# Patient Record
Sex: Male | Born: 1975 | Race: White | Hispanic: No | Marital: Married | Smoking: Former smoker
Health system: Southern US, Community
[De-identification: ages and names within clinical notes are randomized; demographics above are authoritative.]

## PROBLEM LIST (undated history)

## (undated) DIAGNOSIS — R519 Headache, unspecified: Secondary | ICD-10-CM

## (undated) DIAGNOSIS — K5790 Diverticulosis of intestine, part unspecified, without perforation or abscess without bleeding: Secondary | ICD-10-CM

## (undated) DIAGNOSIS — K648 Other hemorrhoids: Secondary | ICD-10-CM

## (undated) DIAGNOSIS — K219 Gastro-esophageal reflux disease without esophagitis: Secondary | ICD-10-CM

## (undated) DIAGNOSIS — K2270 Barrett's esophagus without dysplasia: Secondary | ICD-10-CM

## (undated) HISTORY — DX: Other hemorrhoids: K64.8

## (undated) HISTORY — DX: Barrett's esophagus without dysplasia: K22.70

## (undated) HISTORY — DX: Diverticulosis of intestine, part unspecified, without perforation or abscess without bleeding: K57.90

## (undated) HISTORY — DX: Headache, unspecified: R51.9

## (undated) HISTORY — PX: UPPER GASTROINTESTINAL ENDOSCOPY: SHX188

## (undated) HISTORY — DX: Gastro-esophageal reflux disease without esophagitis: K21.9

---
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
## 1996-03-28 HISTORY — PX: WISDOM TOOTH EXTRACTION: SHX21

## 1997-03-28 HISTORY — PX: OTHER SURGICAL HISTORY: SHX169

## 2004-12-27 ENCOUNTER — Emergency Department (HOSPITAL_COMMUNITY): Admission: EM | Admit: 2004-12-27 | Discharge: 2004-12-27 | Payer: Self-pay | Admitting: Emergency Medicine

## 2005-01-23 ENCOUNTER — Emergency Department (HOSPITAL_COMMUNITY): Admission: EM | Admit: 2005-01-23 | Discharge: 2005-01-23 | Payer: Self-pay | Admitting: Emergency Medicine

## 2005-03-05 ENCOUNTER — Emergency Department (HOSPITAL_COMMUNITY): Admission: AD | Admit: 2005-03-05 | Discharge: 2005-03-05 | Payer: Self-pay | Admitting: Family Medicine

## 2005-08-17 ENCOUNTER — Emergency Department (HOSPITAL_COMMUNITY): Admission: EM | Admit: 2005-08-17 | Discharge: 2005-08-17 | Payer: Self-pay | Admitting: Family Medicine

## 2005-10-13 ENCOUNTER — Emergency Department (HOSPITAL_COMMUNITY): Admission: EM | Admit: 2005-10-13 | Discharge: 2005-10-13 | Payer: Self-pay | Admitting: Family Medicine

## 2008-07-29 ENCOUNTER — Emergency Department (HOSPITAL_COMMUNITY): Admission: EM | Admit: 2008-07-29 | Discharge: 2008-07-29 | Payer: Self-pay | Admitting: Emergency Medicine

## 2008-09-07 ENCOUNTER — Emergency Department (HOSPITAL_COMMUNITY): Admission: EM | Admit: 2008-09-07 | Discharge: 2008-09-07 | Payer: Self-pay | Admitting: Emergency Medicine

## 2009-04-11 ENCOUNTER — Emergency Department (HOSPITAL_COMMUNITY): Admission: EM | Admit: 2009-04-11 | Discharge: 2009-04-11 | Payer: Self-pay | Admitting: Family Medicine

## 2009-06-15 ENCOUNTER — Ambulatory Visit: Payer: Self-pay | Admitting: Family Medicine

## 2009-06-17 ENCOUNTER — Telehealth: Payer: Self-pay | Admitting: Family Medicine

## 2009-06-17 LAB — CONVERTED CEMR LAB
Albumin: 4.3 g/dL (ref 3.5–5.2)
Basophils Relative: 0.9 % (ref 0.0–3.0)
CO2: 31 meq/L (ref 19–32)
Chloride: 107 meq/L (ref 96–112)
Cholesterol: 171 mg/dL (ref 0–200)
Creatinine, Ser: 1 mg/dL (ref 0.4–1.5)
Eosinophils Absolute: 0.1 10*3/uL (ref 0.0–0.7)
HCT: 42.9 % (ref 39.0–52.0)
Lymphs Abs: 1.2 10*3/uL (ref 0.7–4.0)
MCHC: 32.6 g/dL (ref 30.0–36.0)
MCV: 92.1 fL (ref 78.0–100.0)
Monocytes Absolute: 0.4 10*3/uL (ref 0.1–1.0)
Neutro Abs: 2.8 10*3/uL (ref 1.4–7.7)
Neutrophils Relative %: 61.6 % (ref 43.0–77.0)
RBC: 4.66 M/uL (ref 4.22–5.81)
TSH: 2.91 microintl units/mL (ref 0.35–5.50)
Total CHOL/HDL Ratio: 3
Total Protein: 7.5 g/dL (ref 6.0–8.3)
Triglycerides: 43 mg/dL (ref 0.0–149.0)

## 2010-01-27 ENCOUNTER — Emergency Department (HOSPITAL_COMMUNITY): Admission: EM | Admit: 2010-01-27 | Discharge: 2010-01-27 | Payer: Self-pay | Admitting: Family Medicine

## 2010-04-27 NOTE — Assessment & Plan Note (Signed)
Summary: new to est//ccm   Vital Signs:  Patient profile:   35 year old male Height:      70.50 inches Weight:      202 pounds BMI:     28.68 Temp:     97.2 degrees F oral Pulse rate:   72 / minute Pulse rhythm:   regular Resp:     12 per minute BP sitting:   100 / 68  (left arm) Cuff size:   large  Vitals Entered By: Sid Falcon LPN (June 15, 2009 2:19 PM)  Nutrition Counseling: Patient's BMI is greater than 25 and therefore counseled on weight management options. CC: New to establish   History of Present Illness: New patient to establish care and for complete physical.  No chronic medical problems. History of complicated right leg fracture related to accident back in 2000 and has recovered well and no problems since then. Previous tonsillectomy. No medications. No known drug allergy.  Family history significant for mother alcoholism. Father with hypertension and hx CHF. Grandmother with ovarian cancer.  Patient is married has 3 children ranging from 4 months for 15 years old. Ex-smoker. No alcohol use.  Last tetanus over 10 years ago.  Preventive Screening-Counseling & Management  Alcohol-Tobacco     Smoking Status: quit     Year Started: 1990     Year Quit: 2006  Allergies (verified): No Known Drug Allergies  Past History:  Family History: Last updated: 06/15/2009 Family History of Alcoholism/Addiction, depression, mother Heart disease, hypertension, father  Social History: Last updated: 06/15/2009 Occupation: Married Alcohol use-no Smoker, quit 2006  Risk Factors: Smoking Status: quit (06/15/2009)  Past Medical History:  Hay Fever, allergies  Past Surgical History: Right leg severely broken 1999 Tonsillectomy 1980 PMH-FH-SH reviewed for relevance  Family History: Family History of Alcoholism/Addiction, depression, mother Heart disease, hypertension, father  Social History: Occupation: Married Alcohol use-no Smoker, quit  2006 Occupation:  employed Smoking Status:  quit  Review of Systems       The patient complains of weight gain.  The patient denies anorexia, fever, weight loss, vision loss, decreased hearing, hoarseness, chest pain, syncope, dyspnea on exertion, peripheral edema, prolonged cough, headaches, hemoptysis, abdominal pain, melena, hematochezia, severe indigestion/heartburn, hematuria, incontinence, genital sores, muscle weakness, suspicious skin lesions, transient blindness, difficulty walking, depression, unusual weight change, abnormal bleeding, enlarged lymph nodes, and testicular masses.    Physical Exam  General:  Well-developed,well-nourished,in no acute distress; alert,appropriate and cooperative throughout examination Head:  Normocephalic and atraumatic without obvious abnormalities. No apparent alopecia or balding. Eyes:  No corneal or conjunctival inflammation noted. EOMI. Perrla. Funduscopic exam benign, without hemorrhages, exudates or papilledema. Vision grossly normal. Ears:  External ear exam shows no significant lesions or deformities.  Otoscopic examination reveals clear canals, tympanic membranes are intact bilaterally without bulging, retraction, inflammation or discharge. Hearing is grossly normal bilaterally. Mouth:  Oral mucosa and oropharynx without lesions or exudates.  Teeth in good repair. Neck:  No deformities, masses, or tenderness noted. Lungs:  Normal respiratory effort, chest expands symmetrically. Lungs are clear to auscultation, no crackles or wheezes. Heart:  Normal rate and regular rhythm. S1 and S2 normal without gallop, murmur, click, rub or other extra sounds. Abdomen:  Bowel sounds positive,abdomen soft and non-tender without masses, organomegaly or hernias noted. Genitalia:  Testes bilaterally descended without nodularity, tenderness or masses. No scrotal masses or lesions. No penis lesions or urethral discharge. Msk:  No deformity or scoliosis noted of  thoracic or lumbar spine.  Extremities:  no edema. Scars right leg from prior surgery Neurologic:  alert & oriented X3, cranial nerves II-XII intact, strength normal in all extremities, and DTRs symmetrical and normal.   Skin:  Intact without suspicious lesions or rashes Cervical Nodes:  No lymphadenopathy noted Psych:  Cognition and judgment appear intact. Alert and cooperative with normal attention span and concentration. No apparent delusions, illusions, hallucinations   Impression & Recommendations:  Problem # 1:  Preventive Health Care (ICD-V70.0) obtain screening lab work. Tetanus booster will be given. Initiate regular exercise program.  Other Orders: Venipuncture (78938) Tdap => 73yrs IM (10175) Admin 1st Vaccine (10258) TLB-Lipid Panel (80061-LIPID) TLB-BMP (Basic Metabolic Panel-BMET) (80048-METABOL) TLB-CBC Platelet - w/Differential (85025-CBCD) TLB-Hepatic/Liver Function Pnl (80076-HEPATIC) TLB-TSH (Thyroid Stimulating Hormone) (52778-EUM)  Patient Instructions: 1)  Please schedule a follow-up appointment in 1 year.  2)  It is important that you exercise reguarly at least 20 minutes 5 times a week. If you develop chest pain, have severe difficulty breathing, or feel very tired, stop exercising immediately and seek medical attention.   Preventive Care Screening  Last Tetanus Booster:    Date:  03/28/1997    Results:  Historical     Immunizations Administered:  Tetanus Vaccine:    Vaccine Type: Tdap    Site: left deltoid    Mfr: GlaxoSmithKline    Dose: 0.5 ml    Route: IM    Given by: Sid Falcon LPN    Exp. Date: 06/19/2011    Lot #: [id number]

## 2010-04-27 NOTE — Progress Notes (Signed)
Summary: lab results request  Phone Note Call from Patient Call back at Home Phone [phone]   Caller: Patient Call For: Evelena Peat MD Summary of Call: pt is req lab results Initial call taken by: Heron Sabins,  June 17, 2009 2:40 PM  Follow-up for Phone Call        Pt informed labs all OK Follow-up by: Sid Falcon LPN,  June 17, 2009 2:53 PM

## 2010-08-06 ENCOUNTER — Inpatient Hospital Stay (INDEPENDENT_AMBULATORY_CARE_PROVIDER_SITE_OTHER)
Admission: RE | Admit: 2010-08-06 | Discharge: 2010-08-06 | Disposition: A | Discharge status: Home / Self Care | Payer: Self-pay | Source: Ambulatory Visit | Attending: Emergency Medicine | Admitting: Emergency Medicine

## 2010-08-06 DIAGNOSIS — IMO0002 Reserved for concepts with insufficient information to code with codable children: Secondary | ICD-10-CM

## 2011-01-18 ENCOUNTER — Emergency Department (HOSPITAL_COMMUNITY): Payer: Self-pay

## 2011-01-18 ENCOUNTER — Emergency Department (HOSPITAL_COMMUNITY)
Admission: EM | Admit: 2011-01-18 | Discharge: 2011-01-19 | Disposition: A | Discharge status: Home / Self Care | Payer: Self-pay | Attending: Emergency Medicine | Admitting: Emergency Medicine

## 2011-01-18 DIAGNOSIS — R079 Chest pain, unspecified: Secondary | ICD-10-CM | POA: Insufficient documentation

## 2011-01-18 DIAGNOSIS — R0602 Shortness of breath: Secondary | ICD-10-CM | POA: Insufficient documentation

## 2011-01-18 DIAGNOSIS — R42 Dizziness and giddiness: Secondary | ICD-10-CM | POA: Insufficient documentation

## 2011-01-18 LAB — DIFFERENTIAL
Basophils Absolute: 0.1 10*3/uL (ref 0.0–0.1)
Basophils Relative: 1 % (ref 0–1)
Eosinophils Absolute: 0.2 10*3/uL (ref 0.0–0.7)
Neutro Abs: 3 10*3/uL (ref 1.7–7.7)
Neutrophils Relative %: 59 % (ref 43–77)

## 2011-01-18 LAB — BASIC METABOLIC PANEL
Chloride: 102 mEq/L (ref 96–112)
Creatinine, Ser: 1.04 mg/dL (ref 0.50–1.35)
GFR calc Af Amer: >90 mL/min (ref >90)
GFR calc non Af Amer: >90 mL/min (ref >90)
Potassium: 3.5 mEq/L (ref 3.5–5.1)

## 2011-01-18 LAB — CBC
Hemoglobin: 15.5 g/dL (ref 13.0–17.0)
Platelets: 261 10*3/uL (ref 150–400)
RBC: 5.28 MIL/uL (ref 4.22–5.81)

## 2011-01-18 LAB — POCT I-STAT TROPONIN I: Troponin i, poc: 0.01 ng/mL (ref 0.00–0.08)

## 2011-01-26 ENCOUNTER — Ambulatory Visit (INDEPENDENT_AMBULATORY_CARE_PROVIDER_SITE_OTHER): Payer: Self-pay | Admitting: Family Medicine

## 2011-01-26 ENCOUNTER — Encounter: Payer: Self-pay | Admitting: Family Medicine

## 2011-01-26 VITALS — BP 90/60 | Temp 97.8°F | Ht 72.0 in | Wt 236.0 lb

## 2011-01-26 DIAGNOSIS — R0789 Other chest pain: Secondary | ICD-10-CM

## 2011-01-26 DIAGNOSIS — R0989 Other specified symptoms and signs involving the circulatory and respiratory systems: Secondary | ICD-10-CM

## 2011-01-26 DIAGNOSIS — R0600 Dyspnea, unspecified: Secondary | ICD-10-CM

## 2011-01-26 DIAGNOSIS — K219 Gastro-esophageal reflux disease without esophagitis: Secondary | ICD-10-CM

## 2011-01-26 NOTE — Progress Notes (Signed)
  Subjective:    Patient ID: Antonio Ward, male    DOB: 07-18-1975, 35 y.o.   MRN: [id number]  HPI  Emergency room followup. Recent emergency room notes reviewed. Patient had some recent flying to and felt very anxious during that time. After returning he's had some intermittent bouts with dull substernal ache. Occasionally at rest and occasionally with walking. He's had some associated lightheadedness with walking but no syncope. Frequent burping. Had one episode recently where he was driving and difficulty swallowing followed by dull ache in chest and lightheadedness. Denies any radiation of pain. Evaluated in emergency room. Cardiac enzymes negative. Chest x-ray borderline cardiomegaly otherwise no acute findings. Labs all normal. No suspicion for DVT. No pleuritic pain, hemoptysis, or tachypnea. Patient was on omeprazole and thinks this may be helping somewhat though he still has some intermittent discomfort. He had some recent weight gain.  Recent EKG unremarkable.   Quit smoking several years ago. No history of diabetes or hypertension. Father had coronary disease in his 56s.  Past Medical History  Diagnosis Date  . Hay fever    Past Surgical History  Procedure Date  . Right leg severely broken 1999  . Tonsillectomy 1980    reports that he quit smoking about 8 years ago. His smoking use included Cigarettes. He has a 10 pack-year smoking history. He does not have any smokeless tobacco history on file. His alcohol and drug histories not on file. family history includes Alcohol abuse in his mother; Depression in his mother; Heart disease in his father; and Hypertension in his father. Not on File    Review of Systems  Constitutional: Negative for unexpected weight change.  Eyes: Negative for visual disturbance.  Respiratory: Negative for cough, shortness of breath and wheezing.   Cardiovascular: Positive for chest pain. Negative for leg swelling.  Gastrointestinal: Negative for nausea,  vomiting, diarrhea, constipation and blood in stool.  Neurological: Positive for light-headedness. Negative for seizures, syncope and weakness.  Hematological: Negative for adenopathy.       Objective:   Physical Exam  Constitutional: He is oriented to person, place, and time. He appears well-developed and well-nourished.  Neck: Neck supple. No thyromegaly present.  Cardiovascular: Normal rate, regular rhythm and normal heart sounds.   No murmur heard. Pulmonary/Chest: Effort normal and breath sounds normal. No respiratory distress. He has no wheezes. He has no rales.  Musculoskeletal: He exhibits no edema.  Neurological: He is alert and oriented to person, place, and time.  Psychiatric: He has a normal mood and affect. His behavior is normal.          Assessment & Plan:  Atypical chest pain. Suspect this is probably related to GERD and/or esophageal spasm but with inconsistent symptoms and occasional possible relation to walking and reports of borderline cardiomegaly on chest x-ray schedule stress echo to further assess. Maintain Prilosec in the meantime. Dietary information bland diet given.

## 2011-01-26 NOTE — Patient Instructions (Signed)
Diet for GERD or PUD Nutrition therapy can help ease the discomfort of gastroesophageal reflux disease (GERD) and peptic ulcer disease (PUD).  HOME CARE INSTRUCTIONS   Eat your meals slowly, in a relaxed setting.   Eat 5 to 6 small meals per day.   If a food causes distress, stop eating it for a period of time.  FOODS TO AVOID  Coffee, regular or decaffeinated.   Cola beverages, regular or low calorie.   Tea, regular or decaffeinated.   Pepper.   Cocoa.   High fat foods, including meats.   Butter, margarine, hydrogenated oil (trans fats).   Peppermint or spearmint (if you have GERD).   Fruits and vegetables if not tolerated.   Alcohol.   Nicotine (smoking or chewing). This is one of the most potent stimulants to acid production in the gastrointestinal tract.   Any food that seems to aggravate your condition.  If you have questions regarding your diet, ask your caregiver or a registered dietitian. TIPS  Lying flat may make symptoms worse. Keep the head of your bed raised 6 to 9 inches (15 to 23 cm) by using a foam wedge or blocks under the legs of the bed.   Do not lay down until 3 hours after eating a meal.   Daily physical activity may help reduce symptoms.  MAKE SURE YOU:   Understand these instructions.   Will watch your condition.   Will get help right away if you are not doing well or get worse.  Document Released: 03/14/2005 Document Revised: 11/24/2010 Document Reviewed: 07/28/2008 [facility] Patient Information 2012 [place], [place].  Remain on omeprazole for at least 6 weeks.

## 2011-01-28 ENCOUNTER — Ambulatory Visit (HOSPITAL_COMMUNITY): Payer: Self-pay | Attending: Family Medicine | Admitting: Radiology

## 2011-01-28 ENCOUNTER — Other Ambulatory Visit (HOSPITAL_COMMUNITY): Payer: Self-pay | Admitting: Radiology

## 2011-01-28 ENCOUNTER — Ambulatory Visit (HOSPITAL_BASED_OUTPATIENT_CLINIC_OR_DEPARTMENT_OTHER): Payer: Self-pay | Admitting: Radiology

## 2011-01-28 DIAGNOSIS — R42 Dizziness and giddiness: Secondary | ICD-10-CM | POA: Insufficient documentation

## 2011-01-28 DIAGNOSIS — R0789 Other chest pain: Secondary | ICD-10-CM | POA: Insufficient documentation

## 2011-01-28 DIAGNOSIS — Z87891 Personal history of nicotine dependence: Secondary | ICD-10-CM | POA: Insufficient documentation

## 2011-01-28 DIAGNOSIS — R0989 Other specified symptoms and signs involving the circulatory and respiratory systems: Secondary | ICD-10-CM | POA: Insufficient documentation

## 2011-01-28 DIAGNOSIS — R0609 Other forms of dyspnea: Secondary | ICD-10-CM | POA: Insufficient documentation

## 2011-01-28 DIAGNOSIS — R072 Precordial pain: Secondary | ICD-10-CM

## 2011-02-01 ENCOUNTER — Telehealth: Payer: Self-pay | Admitting: *Deleted

## 2011-02-01 NOTE — Telephone Encounter (Signed)
Yes.  Continue Omeprazole for at least 1 month then try leaving off.

## 2011-02-01 NOTE — Telephone Encounter (Signed)
Pt is questioning if he is to still take Omeprazole

## 2011-02-01 NOTE — Progress Notes (Signed)
Quick Note:  Pt informed ______ 

## 2011-02-02 NOTE — Telephone Encounter (Signed)
Pt informed, and please schedule return OV in 1 month

## 2011-03-04 ENCOUNTER — Ambulatory Visit (INDEPENDENT_AMBULATORY_CARE_PROVIDER_SITE_OTHER): Payer: BC Managed Care – PPO | Admitting: Family Medicine

## 2011-03-04 ENCOUNTER — Encounter: Payer: Self-pay | Admitting: Family Medicine

## 2011-03-04 VITALS — BP 80/48 | Temp 97.6°F | Wt 235.0 lb

## 2011-03-04 DIAGNOSIS — R0789 Other chest pain: Secondary | ICD-10-CM

## 2011-03-04 NOTE — Patient Instructions (Signed)
Consider stopping omeprazole in the next 1-2 weeks Follow up immediately for any recurrent chest pain.

## 2011-03-04 NOTE — Progress Notes (Signed)
  Subjective:    Patient ID: Antonio Ward, male    DOB: 11-02-1975, 35 y.o.   MRN: [id number]  HPI  Patient seen for followup of recent atypical chest pain. Stress echo unremarkable. He has low risk factors. No chest pain since then. Question reflux. Remains on omeprazole 20 mg daily.  Has recently started exercising some with no recurrent chest pain. No dyspnea. No dysphagia. Patient is an ex-smoker and quit about 8 years ago  Review of Systems  HENT: Negative for trouble swallowing.   Respiratory: Negative for shortness of breath.   Cardiovascular: Negative for chest pain, palpitations and leg swelling.  Gastrointestinal: Negative for nausea, vomiting and abdominal pain.  Neurological: Negative for dizziness.       Objective:   Physical Exam  Constitutional: He is oriented to person, place, and time. He appears well-developed and well-nourished. No distress.  HENT:  Mouth/Throat: Oropharynx is clear and moist.  Neck: Neck supple.  Cardiovascular: Normal rate, regular rhythm and normal heart sounds.   Pulmonary/Chest: Effort normal and breath sounds normal. No respiratory distress. He has no wheezes. He has no rales.  Musculoskeletal: He exhibits no edema.  Lymphadenopathy:    He has no cervical adenopathy.  Neurological: He is alert and oriented to person, place, and time.          Assessment & Plan:  Recent atypical chest pain. No further recurrence. Stress echo normal. Taper off omeprazole over the next few weeks. GERD prevention discussed. Followup if any recurrent symptoms

## 2012-01-02 ENCOUNTER — Ambulatory Visit (INDEPENDENT_AMBULATORY_CARE_PROVIDER_SITE_OTHER): Payer: BC Managed Care – PPO | Admitting: Family Medicine

## 2012-01-02 ENCOUNTER — Encounter: Payer: Self-pay | Admitting: Family Medicine

## 2012-01-02 VITALS — BP 90/60 | Temp 98.0°F | Wt 244.0 lb

## 2012-01-02 DIAGNOSIS — H00019 Hordeolum externum unspecified eye, unspecified eyelid: Secondary | ICD-10-CM

## 2012-01-02 MED ORDER — TOBRAMYCIN 0.3 % OP SOLN: 1.0000 [drp] | OPHTHALMIC | Status: DC

## 2012-01-02 NOTE — Progress Notes (Signed)
  Subjective:    Patient ID: Antonio Ward, male    DOB: Aug 25, 1975, 36 y.o.   MRN: [id number]  HPI  Left upper eyelid irritation and swelling of one day duration. He had now 3 episodes of stye within the past several months. He recalls rubbing his eye vigorously after some itching a few days ago. Yesterday developed the swelling and redness. Small pustule noted at border. No vesicles. No visual changes. No eye drainage. Minimal discomfort   Review of Systems  Constitutional: Negative for fever and chills.  Eyes: Negative for discharge and visual disturbance.       Objective:   Physical Exam  Constitutional: He appears well-developed and well-nourished.  Eyes: Conjunctivae normal are normal. Pupils are equal, round, and reactive to light. Right eye exhibits no discharge. Left eye exhibits no discharge.       Left upper eyelid medially reveals some erythema and swelling and very small yellowish pustule noted the center. Conjunctiva appears normal. Cornea normal  Cardiovascular: Normal rate and regular rhythm.           Assessment & Plan:  Left upper lid stye/hordeolum.  Warm compresses several times daily. Tobrex eyedrops 1-2 drops left eye every 4 hours while awake. Touch base 3-4 days if not resolving with warm compresses

## 2012-01-02 NOTE — Patient Instructions (Addendum)
Sty  A sty (hordeolum) is an infection of a gland in the eyelid located at the base of the eyelash. A sty may develop a white or yellow head of pus. It can be puffy (swollen). Usually, the sty will burst and pus will come out on its own. They do not leave lumps in the eyelid once they drain.  A sty is often confused with another form of cyst of the eyelid called a chalazion. Chalazions occur within the eyelid and not on the edge where the bases of the eyelashes are. They often are red, sore and then form firm lumps in the eyelid.  CAUSES    Germs (bacteria).   Lasting (chronic) eyelid inflammation.  SYMPTOMS    Tenderness, redness and swelling along the edge of the eyelid at the base of the eyelashes.   Sometimes, there is a white or yellow head of pus. It may or may not drain.  DIAGNOSIS   An ophthalmologist will be able to distinguish between a sty and a chalazion and treat the condition appropriately.   TREATMENT    Styes are typically treated with warm packs (compresses) until drainage occurs.   In rare cases, medicines that kill germs (antibiotics) may be prescribed. These antibiotics may be in the form of drops, cream or pills.   If a hard lump has formed, it is generally necessary to do a small incision and remove the hardened contents of the cyst in a minor surgical procedure done in the office.   In suspicious cases, your caregiver may send the contents of the cyst to the lab to be certain that it is not a rare, but dangerous form of cancer of the glands of the eyelid.  HOME CARE INSTRUCTIONS    Wash your hands often and dry them with a clean towel. Avoid touching your eyelid. This may spread the infection to other parts of the eye.   Apply heat to your eyelid for 10 to 20 minutes, several times a day, to ease pain and help to heal it faster.   Do not squeeze the sty. Allow it to drain on its own. Wash your eyelid carefully 3 to 4 times per day to remove any pus.  SEEK IMMEDIATE MEDICAL CARE IF:     Your eye becomes painful or puffy (swollen).   Your vision changes.   Your sty does not drain by itself within 3 days.   Your sty comes back within a short period of time, even with treatment.   You have redness (inflammation) around the eye.   You have a fever.  Document Released: 12/22/2004 Document Revised: 06/06/2011 Document Reviewed: 08/26/2008  [employer] Patient Information 2013 [employer].

## 2012-06-25 ENCOUNTER — Encounter: Payer: Self-pay | Admitting: Family Medicine

## 2012-06-25 ENCOUNTER — Ambulatory Visit (INDEPENDENT_AMBULATORY_CARE_PROVIDER_SITE_OTHER): Payer: BC Managed Care – PPO | Admitting: Family Medicine

## 2012-06-25 VITALS — BP 110/78 | Temp 97.9°F | Wt 254.0 lb

## 2012-06-25 DIAGNOSIS — K219 Gastro-esophageal reflux disease without esophagitis: Secondary | ICD-10-CM

## 2012-06-25 DIAGNOSIS — R0602 Shortness of breath: Secondary | ICD-10-CM

## 2012-06-25 DIAGNOSIS — R0789 Other chest pain: Secondary | ICD-10-CM

## 2012-06-25 MED ORDER — PANTOPRAZOLE SODIUM 40 MG PO TBEC: 40.0000 mg | DELAYED_RELEASE_TABLET | Freq: Every day | ORAL | Status: DC

## 2012-06-25 NOTE — Patient Instructions (Addendum)
Continue with weight loss efforts. Follow up immediately for any exertional chest pain.  Gastroesophageal Reflux Disease, Adult Gastroesophageal reflux disease (GERD) happens when acid from your stomach flows up into the esophagus. When acid comes in contact with the esophagus, the acid causes soreness (inflammation) in the esophagus. Over time, GERD may create small holes (ulcers) in the lining of the esophagus. CAUSES   Increased body weight. This puts pressure on the stomach, making acid rise from the stomach into the esophagus.  Smoking. This increases acid production in the stomach.  Drinking alcohol. This causes decreased pressure in the lower esophageal sphincter (valve or ring of muscle between the esophagus and stomach), allowing acid from the stomach into the esophagus.  Late evening meals and a full stomach. This increases pressure and acid production in the stomach.  A malformed lower esophageal sphincter. Sometimes, no cause is found. SYMPTOMS   Burning pain in the lower part of the mid-chest behind the breastbone and in the mid-stomach area. This may occur twice a week or more often.  Trouble swallowing.  Sore throat.  Dry cough.  Asthma-like symptoms including chest tightness, shortness of breath, or wheezing. DIAGNOSIS  Your caregiver may be able to diagnose GERD based on your symptoms. In some cases, X-rays and other tests may be done to check for complications or to check the condition of your stomach and esophagus. TREATMENT  Your caregiver may recommend over-the-counter or prescription medicines to help decrease acid production. Ask your caregiver before starting or adding any new medicines.  HOME CARE INSTRUCTIONS   Change the factors that you can control. Ask your caregiver for guidance concerning weight loss, quitting smoking, and alcohol consumption.  Avoid foods and drinks that make your symptoms worse, such as:  Caffeine or alcoholic  drinks.  Chocolate.  Peppermint or mint flavorings.  Garlic and onions.  Spicy foods.  Citrus fruits, such as oranges, lemons, or limes.  Tomato-based foods such as sauce, chili, salsa, and pizza.  Fried and fatty foods.  Avoid lying down for the 3 hours prior to your bedtime or prior to taking a nap.  Eat small, frequent meals instead of large meals.  Wear loose-fitting clothing. Do not wear anything tight around your waist that causes pressure on your stomach.  Raise the head of your bed 6 to 8 inches with wood blocks to help you sleep. Extra pillows will not help.  Only take over-the-counter or prescription medicines for pain, discomfort, or fever as directed by your caregiver.  Do not take aspirin, ibuprofen, or other nonsteroidal anti-inflammatory drugs (NSAIDs). SEEK IMMEDIATE MEDICAL CARE IF:   You have pain in your arms, neck, jaw, teeth, or back.  Your pain increases or changes in intensity or duration.  You develop nausea, vomiting, or sweating (diaphoresis).  You develop shortness of breath, or you faint.  Your vomit is green, yellow, black, or looks like coffee grounds or blood.  Your stool is red, bloody, or black. These symptoms could be signs of other problems, such as heart disease, gastric bleeding, or esophageal bleeding. MAKE SURE YOU:   Understand these instructions.  Will watch your condition.  Will get help right away if you are not doing well or get worse. Document Released: 12/22/2004 Document Revised: 06/06/2011 Document Reviewed: 10/01/2010 [facility] Patient Information 2013 [place], [place].

## 2012-06-25 NOTE — Progress Notes (Signed)
  Subjective:    Patient ID: Antonio Ward, male    DOB: 1975/05/17, 37 y.o.   MRN: [id number]  HPI Patient seen with intermittent chest discomfort Patient had similar symptoms back in 2012 and had workup then including stress echo which was normal Patient relates fairly frequent episodes at rest. Has some substernal discomfort which varies in intensity and duration. Symptoms are dull and relatively mild intensity. He has never had any exertional symptoms. Yesterday he exercised for one hour fairly vigorously with no symptoms whatsoever He notices that burping helps relieve symptoms. Has fairly frequent symptoms of sour taste in mouth but no classic GERD symptoms otherwise. Pains do not radiate. No odontophagia.  Significant weight gain over the past year. He plans to start weight loss program soon. Patient quit smoking several years ago. No regular nonsteroidal use. No alcohol use No melena. No hematemesis. No family history of premature CAD Denies any recent cough or wheeze. No pleuritic pain. Denies any anxiety symptoms consistent with panic disorder  Past Medical History  Diagnosis Date  . Hay fever    Past Surgical History  Procedure Laterality Date  . Right leg severely broken  1999  . Tonsillectomy  1980    reports that he quit smoking about 9 years ago. His smoking use included Cigarettes. He has a 10 pack-year smoking history. He does not have any smokeless tobacco history on file. His alcohol and drug histories are not on file. family history includes Alcohol abuse in his mother; Depression in his mother; Heart disease in his father; and Hypertension in his father. No Known Allergies    Review of Systems  Constitutional: Negative for fever, chills, diaphoresis, appetite change, fatigue and unexpected weight change.  HENT: Negative for trouble swallowing and voice change.   Respiratory: Negative for cough.   Cardiovascular: Positive for chest pain. Negative for  palpitations and leg swelling.  Gastrointestinal: Negative for nausea, vomiting and abdominal pain.  Neurological: Negative for syncope.       Objective:   Physical Exam  Constitutional: He appears well-developed and well-nourished.  HENT:  Mouth/Throat: Oropharynx is clear and moist.  Neck: Neck supple. No thyromegaly present.  Cardiovascular: Normal rate and regular rhythm.  Exam reveals no gallop.   No murmur heard. Pulmonary/Chest: Effort normal and breath sounds normal. No respiratory distress. He has no wheezes. He has no rales.  Abdominal: Soft. He exhibits no distension and no mass. There is no rebound and no guarding.  Minimal tenderness epigastric region with no guarding or rebound. No masses  Musculoskeletal: He exhibits no edema.          Assessment & Plan:  Atypical chest pain. Never related to exertion. Negative previous evaluation as above. Relatively low risk. Suspect GERD related. Trial of Protonix 40 mg once daily. Work on weight loss. Followup immediately for any exertional symptoms. EKG here reveals sinus rhythm with no acute changes.

## 2012-06-29 ENCOUNTER — Ambulatory Visit: Payer: BC Managed Care – PPO | Admitting: Family Medicine

## 2013-03-18 ENCOUNTER — Encounter: Payer: Self-pay | Admitting: Cardiovascular Disease

## 2013-12-05 ENCOUNTER — Encounter (HOSPITAL_COMMUNITY): Payer: Self-pay | Admitting: Emergency Medicine

## 2013-12-05 ENCOUNTER — Emergency Department (INDEPENDENT_AMBULATORY_CARE_PROVIDER_SITE_OTHER)
Admission: EM | Admit: 2013-12-05 | Discharge: 2013-12-05 | Disposition: A | Discharge status: Home / Self Care | Payer: BC Managed Care – PPO | Source: Home / Self Care | Attending: Family Medicine | Admitting: Family Medicine

## 2013-12-05 DIAGNOSIS — L03221 Cellulitis of neck: Secondary | ICD-10-CM

## 2013-12-05 DIAGNOSIS — L0211 Cutaneous abscess of neck: Secondary | ICD-10-CM

## 2013-12-05 MED ORDER — SULFAMETHOXAZOLE-TMP DS 800-160 MG PO TABS: 1.0000 | ORAL_TABLET | Freq: Two times a day (BID) | ORAL | Status: DC
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
## 2013-12-05 NOTE — Discharge Instructions (Signed)

## 2013-12-05 NOTE — ED Notes (Signed)
Pt. Said he had asked for liquid medicine.  Discussed how to break up tablets and put in a small amount of applesauce or pudding.

## 2013-12-05 NOTE — ED Notes (Addendum)
Wife pulled out ingrown hair behind R ear on Sun. ( wife is a Engineer, civil (consulting)).  He cleaned it with a warm cloth and warm compresses.  Swelled up on Tues AM, no drainage.  Has scab in the middle of it. Tried Tea tree oil, witch hazel and Neosporin on it without relief.

## 2013-12-07 NOTE — ED Provider Notes (Signed)
Medical screening examination/treatment/procedure(s) were performed by a resident physician or non-physician practitioner and as the supervising physician I was immediately available for consultation/collaboration.  Yelena Metzer, MD    Justine Dines S Shamecka Hocutt, MD 12/07/13 0858 

## 2013-12-13 ENCOUNTER — Other Ambulatory Visit (INDEPENDENT_AMBULATORY_CARE_PROVIDER_SITE_OTHER): Payer: BC Managed Care – PPO

## 2013-12-13 DIAGNOSIS — Z0000 Encounter for general adult medical examination without abnormal findings: Secondary | ICD-10-CM

## 2013-12-13 LAB — HEPATIC FUNCTION PANEL
ALT: 25 U/L (ref 0–53)
AST: 27 U/L (ref 0–37)
Albumin: 4.3 g/dL (ref 3.5–5.2)
Alkaline Phosphatase: 63 U/L (ref 39–117)
BILIRUBIN TOTAL: 0.4 mg/dL (ref 0.2–1.2)
Bilirubin, Direct: 0.1 mg/dL (ref 0.0–0.3)
Total Protein: 8.4 g/dL — ABNORMAL HIGH (ref 6.0–8.3)

## 2013-12-13 LAB — CBC WITH DIFFERENTIAL/PLATELET
BASOS ABS: 0 10*3/uL (ref 0.0–0.1)
Basophils Relative: 0.6 % (ref 0.0–3.0)
Eosinophils Absolute: 0.2 10*3/uL (ref 0.0–0.7)
Eosinophils Relative: 4.3 % (ref 0.0–5.0)
HEMATOCRIT: 46.4 % (ref 39.0–52.0)
Hemoglobin: 15.8 g/dL (ref 13.0–17.0)
LYMPHS ABS: 1.3 10*3/uL (ref 0.7–4.0)
Lymphocytes Relative: 22.6 % (ref 12.0–46.0)
MCHC: 34 g/dL (ref 30.0–36.0)
MCV: 86.6 fl (ref 78.0–100.0)
MONO ABS: 0.4 10*3/uL (ref 0.1–1.0)
MONOS PCT: 7.2 % (ref 3.0–12.0)
Neutro Abs: 3.7 10*3/uL (ref 1.4–7.7)
Neutrophils Relative %: 65.3 % (ref 43.0–77.0)
PLATELETS: 309 10*3/uL (ref 150.0–400.0)
RBC: 5.36 Mil/uL (ref 4.22–5.81)
RDW: 13.5 % (ref 11.5–15.5)
WBC: 5.6 10*3/uL (ref 4.0–10.5)

## 2013-12-13 LAB — BASIC METABOLIC PANEL
BUN: 13 mg/dL (ref 6–23)
CO2: 24 mEq/L (ref 19–32)
Calcium: 9.3 mg/dL (ref 8.4–10.5)
Chloride: 105 mEq/L (ref 96–112)
Creatinine, Ser: 1.4 mg/dL (ref 0.4–1.5)
GFR: 62.2 mL/min (ref >60.00)
GLUCOSE: 104 mg/dL — AB (ref 70–99)
POTASSIUM: 4.1 meq/L (ref 3.5–5.1)
Sodium: 138 mEq/L (ref 135–145)

## 2013-12-13 LAB — POCT URINALYSIS DIPSTICK
BILIRUBIN UA: NEGATIVE
GLUCOSE UA: NEGATIVE
Ketones, UA: NEGATIVE
LEUKOCYTES UA: NEGATIVE
NITRITE UA: NEGATIVE
Protein, UA: NEGATIVE
Spec Grav, UA: 1.015
Urobilinogen, UA: 0.2
pH, UA: 5.5

## 2013-12-13 LAB — LIPID PANEL
Cholesterol: 179 mg/dL (ref 0–200)
HDL: 39 mg/dL — AB (ref >39.00)
LDL Cholesterol: 117 mg/dL — ABNORMAL HIGH (ref 0–99)
NONHDL: 140
TRIGLYCERIDES: 114 mg/dL (ref 0.0–149.0)
Total CHOL/HDL Ratio: 5
VLDL: 22.8 mg/dL (ref 0.0–40.0)

## 2013-12-13 LAB — TSH: TSH: 2.96 u[IU]/mL (ref 0.35–4.50)

## 2013-12-20 ENCOUNTER — Encounter: Payer: Self-pay | Admitting: Family Medicine

## 2013-12-20 ENCOUNTER — Ambulatory Visit (INDEPENDENT_AMBULATORY_CARE_PROVIDER_SITE_OTHER): Payer: BC Managed Care – PPO | Admitting: Family Medicine

## 2013-12-20 VITALS — BP 118/76 | HR 80 | Temp 97.7°F | Ht 70.5 in | Wt 247.0 lb

## 2013-12-20 DIAGNOSIS — R319 Hematuria, unspecified: Secondary | ICD-10-CM

## 2013-12-20 DIAGNOSIS — E669 Obesity, unspecified: Secondary | ICD-10-CM | POA: Insufficient documentation

## 2013-12-20 DIAGNOSIS — R6889 Other general symptoms and signs: Secondary | ICD-10-CM

## 2013-12-20 DIAGNOSIS — R899 Unspecified abnormal finding in specimens from other organs, systems and tissues: Secondary | ICD-10-CM

## 2013-12-20 DIAGNOSIS — Z0000 Encounter for general adult medical examination without abnormal findings: Secondary | ICD-10-CM

## 2013-12-20 LAB — POCT URINALYSIS DIPSTICK
BILIRUBIN UA: NEGATIVE
GLUCOSE UA: NEGATIVE
Ketones, UA: NEGATIVE
Leukocytes, UA: NEGATIVE
NITRITE UA: NEGATIVE
Protein, UA: NEGATIVE
RBC UA: NEGATIVE
SPEC GRAV UA: 1.015
Urobilinogen, UA: 0.2
pH, UA: 6.5

## 2013-12-20 NOTE — Progress Notes (Signed)
Pre visit review using our clinic review tool, if applicable. No additional management support is needed unless otherwise documented below in the visit note. 
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
## 2014-01-20 ENCOUNTER — Other Ambulatory Visit: Payer: BC Managed Care – PPO

## 2014-01-21 ENCOUNTER — Other Ambulatory Visit (INDEPENDENT_AMBULATORY_CARE_PROVIDER_SITE_OTHER): Payer: BC Managed Care – PPO

## 2014-01-21 DIAGNOSIS — R899 Unspecified abnormal finding in specimens from other organs, systems and tissues: Secondary | ICD-10-CM

## 2014-01-21 LAB — BASIC METABOLIC PANEL
BUN: 18 mg/dL (ref 6–23)
CHLORIDE: 105 meq/L (ref 96–112)
CO2: 22 mEq/L (ref 19–32)
Calcium: 9.4 mg/dL (ref 8.4–10.5)
Creatinine, Ser: 1.2 mg/dL (ref 0.4–1.5)
GFR: 73.33 mL/min (ref >60.00)
Glucose, Bld: 86 mg/dL (ref 70–99)
POTASSIUM: 4 meq/L (ref 3.5–5.1)
SODIUM: 139 meq/L (ref 135–145)

## 2014-01-21 LAB — HEPATIC FUNCTION PANEL
ALK PHOS: 56 U/L (ref 39–117)
ALT: 28 U/L (ref 0–53)
AST: 23 U/L (ref 0–37)
Albumin: 3.7 g/dL (ref 3.5–5.2)
BILIRUBIN TOTAL: 0.4 mg/dL (ref 0.2–1.2)
Bilirubin, Direct: 0 mg/dL (ref 0.0–0.3)
Total Protein: 7.9 g/dL (ref 6.0–8.3)

## 2015-06-15 ENCOUNTER — Telehealth: Payer: Self-pay | Admitting: Family Medicine

## 2015-06-15 NOTE — Telephone Encounter (Signed)
Entered in chart.

## 2015-06-15 NOTE — Telephone Encounter (Signed)
Pt decline flu shot

## 2015-06-16 ENCOUNTER — Other Ambulatory Visit (INDEPENDENT_AMBULATORY_CARE_PROVIDER_SITE_OTHER): Payer: Self-pay

## 2015-06-16 DIAGNOSIS — Z0000 Encounter for general adult medical examination without abnormal findings: Secondary | ICD-10-CM | POA: Diagnosis not present

## 2015-06-16 LAB — CBC WITH DIFFERENTIAL/PLATELET
BASOS ABS: 0 10*3/uL (ref 0.0–0.1)
BASOS PCT: 0.6 % (ref 0.0–3.0)
Eosinophils Absolute: 0.2 10*3/uL (ref 0.0–0.7)
Eosinophils Relative: 3.7 % (ref 0.0–5.0)
HEMATOCRIT: 45.6 % (ref 39.0–52.0)
HEMOGLOBIN: 15.7 g/dL (ref 13.0–17.0)
LYMPHS ABS: 1.2 10*3/uL (ref 0.7–4.0)
Lymphocytes Relative: 23.2 % (ref 12.0–46.0)
MCHC: 34.4 g/dL (ref 30.0–36.0)
MCV: 84.7 fl (ref 78.0–100.0)
Monocytes Absolute: 0.5 10*3/uL (ref 0.1–1.0)
Monocytes Relative: 9.3 % (ref 3.0–12.0)
NEUTROS PCT: 63.2 % (ref 43.0–77.0)
Neutro Abs: 3.3 10*3/uL (ref 1.4–7.7)
Platelets: 275 10*3/uL (ref 150.0–400.0)
RBC: 5.38 Mil/uL (ref 4.22–5.81)
RDW: 14.1 % (ref 11.5–15.5)
WBC: 5.2 10*3/uL (ref 4.0–10.5)

## 2015-06-16 LAB — HEPATIC FUNCTION PANEL
ALT: 30 U/L (ref 0–53)
AST: 24 U/L (ref 0–37)
Albumin: 4.5 g/dL (ref 3.5–5.2)
Alkaline Phosphatase: 59 U/L (ref 39–117)
Bilirubin, Direct: 0.2 mg/dL (ref 0.0–0.3)
Total Bilirubin: 0.7 mg/dL (ref 0.2–1.2)
Total Protein: 7.6 g/dL (ref 6.0–8.3)

## 2015-06-16 LAB — BASIC METABOLIC PANEL WITH GFR
BUN: 16 mg/dL (ref 6–23)
CO2: 27 meq/L (ref 19–32)
Calcium: 9.8 mg/dL (ref 8.4–10.5)
Chloride: 104 meq/L (ref 96–112)
Creatinine, Ser: 1.11 mg/dL (ref 0.40–1.50)
GFR: 77.38 mL/min
Glucose, Bld: 86 mg/dL (ref 70–99)
Potassium: 4.2 meq/L (ref 3.5–5.1)
Sodium: 139 meq/L (ref 135–145)

## 2015-06-16 LAB — LIPID PANEL
CHOL/HDL RATIO: 3
Cholesterol: 132 mg/dL (ref 0–200)
HDL: 43.4 mg/dL (ref >39.00)
LDL Cholesterol: 72 mg/dL (ref 0–99)
NONHDL: 88.28
Triglycerides: 80 mg/dL (ref 0.0–149.0)
VLDL: 16 mg/dL (ref 0.0–40.0)

## 2015-06-16 LAB — TSH: TSH: 2.85 u[IU]/mL (ref 0.35–4.50)

## 2015-06-22 ENCOUNTER — Encounter: Payer: Self-pay | Admitting: Family Medicine

## 2015-07-01 ENCOUNTER — Encounter: Payer: Self-pay | Admitting: Family Medicine

## 2015-07-01 DIAGNOSIS — Z0289 Encounter for other administrative examinations: Secondary | ICD-10-CM

## 2016-12-05 ENCOUNTER — Encounter: Payer: Self-pay | Admitting: Family Medicine

## 2016-12-05 ENCOUNTER — Ambulatory Visit (INDEPENDENT_AMBULATORY_CARE_PROVIDER_SITE_OTHER): Payer: BLUE CROSS/BLUE SHIELD | Admitting: Family Medicine

## 2016-12-05 VITALS — BP 110/80 | HR 86 | Temp 97.7°F | Ht 71.5 in | Wt 237.7 lb

## 2016-12-05 DIAGNOSIS — Z0001 Encounter for general adult medical examination with abnormal findings: Secondary | ICD-10-CM | POA: Diagnosis not present

## 2016-12-05 DIAGNOSIS — Z0000 Encounter for general adult medical examination without abnormal findings: Secondary | ICD-10-CM

## 2016-12-05 DIAGNOSIS — M25552 Pain in left hip: Secondary | ICD-10-CM

## 2016-12-05 LAB — BASIC METABOLIC PANEL
BUN: 15 mg/dL (ref 6–23)
CHLORIDE: 103 meq/L (ref 96–112)
CO2: 24 mEq/L (ref 19–32)
CREATININE: 1.17 mg/dL (ref 0.40–1.50)
Calcium: 9.7 mg/dL (ref 8.4–10.5)
GFR: 72.31 mL/min (ref >60.00)
GLUCOSE: 96 mg/dL (ref 70–99)
Potassium: 4.5 mEq/L (ref 3.5–5.1)
Sodium: 139 mEq/L (ref 135–145)

## 2016-12-05 LAB — CBC WITH DIFFERENTIAL/PLATELET
BASOS PCT: 2 % (ref 0.0–3.0)
Basophils Absolute: 0.1 10*3/uL (ref 0.0–0.1)
EOS ABS: 0.3 10*3/uL (ref 0.0–0.7)
Eosinophils Relative: 5 % (ref 0.0–5.0)
HEMATOCRIT: 47.1 % (ref 39.0–52.0)
HEMOGLOBIN: 15.7 g/dL (ref 13.0–17.0)
LYMPHS PCT: 23.2 % (ref 12.0–46.0)
Lymphs Abs: 1.2 10*3/uL (ref 0.7–4.0)
MCHC: 33.4 g/dL (ref 30.0–36.0)
MCV: 86.6 fl (ref 78.0–100.0)
MONO ABS: 0.4 10*3/uL (ref 0.1–1.0)
Monocytes Relative: 8.1 % (ref 3.0–12.0)
Neutro Abs: 3.2 10*3/uL (ref 1.4–7.7)
Neutrophils Relative %: 61.7 % (ref 43.0–77.0)
Platelets: 287 10*3/uL (ref 150.0–400.0)
RBC: 5.44 Mil/uL (ref 4.22–5.81)
RDW: 14.2 % (ref 11.5–15.5)
WBC: 5.2 10*3/uL (ref 4.0–10.5)

## 2016-12-05 LAB — LIPID PANEL
CHOLESTEROL: 130 mg/dL (ref 0–200)
HDL: 45.9 mg/dL (ref >39.00)
LDL CALC: 74 mg/dL (ref 0–99)
NONHDL: 84.12
Total CHOL/HDL Ratio: 3
Triglycerides: 49 mg/dL (ref 0.0–149.0)
VLDL: 9.8 mg/dL (ref 0.0–40.0)

## 2016-12-05 LAB — HEPATIC FUNCTION PANEL
ALK PHOS: 53 U/L (ref 39–117)
ALT: 16 U/L (ref 0–53)
AST: 17 U/L (ref 0–37)
Albumin: 4.6 g/dL (ref 3.5–5.2)
BILIRUBIN DIRECT: 0.2 mg/dL (ref 0.0–0.3)
BILIRUBIN TOTAL: 0.7 mg/dL (ref 0.2–1.2)
Total Protein: 7.2 g/dL (ref 6.0–8.3)

## 2016-12-05 LAB — TSH: TSH: 2.54 u[IU]/mL (ref 0.35–4.50)

## 2016-12-05 NOTE — Patient Instructions (Signed)
Consider transitioning from Omeprazole to Zantac or Pepcid.
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
## 2016-12-19 ENCOUNTER — Encounter: Payer: Self-pay | Admitting: Sports Medicine

## 2016-12-19 ENCOUNTER — Ambulatory Visit (INDEPENDENT_AMBULATORY_CARE_PROVIDER_SITE_OTHER): Payer: BLUE CROSS/BLUE SHIELD

## 2016-12-19 ENCOUNTER — Ambulatory Visit (INDEPENDENT_AMBULATORY_CARE_PROVIDER_SITE_OTHER): Payer: BLUE CROSS/BLUE SHIELD | Admitting: Sports Medicine

## 2016-12-19 VITALS — BP 120/82 | HR 74 | Ht 71.5 in | Wt 236.2 lb

## 2016-12-19 DIAGNOSIS — M25552 Pain in left hip: Secondary | ICD-10-CM

## 2016-12-19 DIAGNOSIS — M7632 Iliotibial band syndrome, left leg: Secondary | ICD-10-CM | POA: Diagnosis not present

## 2016-12-19 DIAGNOSIS — M1612 Unilateral primary osteoarthritis, left hip: Secondary | ICD-10-CM | POA: Diagnosis not present
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
## 2016-12-19 NOTE — Procedures (Signed)
PROCEDURE NOTE: THERAPEUTIC EXERCISES (97110) 15 minutes spent for Therapeutic exercises as below and as referenced in the AVS. This included exercises focusing on stretching, strengthening, with significant focus on eccentric aspects.  Proper technique shown and discussed handout in great detail with ATC. All questions were discussed and answered.   Long term goals include an improvement in range of motion, strength, endurance as well as avoiding reinjury. Frequency of visits is one time as determined during today's  office visit. Frequency of exercises to be performed is as per handout.  EXERCISES REVIEWED:   FOAM ROLL - ILIOTIBIAL BAND - ITB   STANDING ILIOTIBIAL BAND STRETCH SUPPORTED - ITB   SIDELYING - STRETCH - ILIOTIBIAL BAND - ITB   HAMSTRING STRETCH WITH STRAP   BRIDGING   HIP ABDUCTION - SIDELYING   HIP HIKES   PRONE HIP EXTENSION

## 2016-12-19 NOTE — Patient Instructions (Addendum)
Also check out [employer]" which is a program developed by Dr. Myles Lipps.   There are links to a couple of his YouTube Videos below and I would like to see performing one of his videos 5-6 days per week.    A good intro video is: "Independence from Pain 7-minute Video" - [email or web address]   His more advanced video is: "Powerful Posture and Pain Relief: 12 minutes of Foundation Training" - [email or web address]  Do not try to attempt this entire video when first beginning.    Try breaking of each exercise that he goes into shorter segments.  Otherwise if they perform an exercise for 45 seconds, start with 15 seconds and rest and then resume when they begin the new activity.    If you work your way up to doing this 12 minute video, I expect you will see significant improvements in your pain.  If you enjoy his videos and would like to find out more you can look on his website: [email or web address].  He has a workout streaming option as well as a DVD set available for purchase.  Amazon has the best price for his DVDs.    Please perform the exercise program that we have prepared for you and gone over in detail on a daily basis.  In addition to the handout you were provided you can access your program through: [email or web address]   Your unique program code is: [email or web address]

## 2016-12-19 NOTE — Assessment & Plan Note (Signed)
Left-sided hip abductor is weak with the glute medius with T FL predominant recruitment pattern.  Therapeutic exercises reviewed and reassurance provided that no significant underlying structural abnormality is present.  We will plan to have him follow-up in 6 weeks to ensure clinical resolution.  If any lack of improvement this is not lateral expect.  If persistent ongoing symptoms could consider further diagnostic evaluation of his lumbar spine but no evidence of  neural tension signs on exam today.

## 2016-12-26 ENCOUNTER — Telehealth: Payer: Self-pay | Admitting: Family Medicine

## 2016-12-26 NOTE — Telephone Encounter (Signed)
Patient would like to receive a call back to discuss a referral. Patient states information is personal.

## 2016-12-26 NOTE — Telephone Encounter (Signed)
I called the pt and attempted to take a detailed message as Dr Caryl Never is seeing patients.  Patient stated he wants to speak with Dr Caryl Never only about a referral and he was informed the message will be sent to Dr Caryl Never.

## 2016-12-26 NOTE — Telephone Encounter (Signed)
Pt would like to discuss further evaluation of his anxiety with psychiatrist.  OK to refer.

## 2016-12-27 NOTE — Telephone Encounter (Signed)
I called the pt and informed him unfortunately we cannot put in a referral for psychiatry.  I gave him the phone number to call [facility] for an appt and he agreed to call.

## 2017-01-30 ENCOUNTER — Ambulatory Visit: Payer: BLUE CROSS/BLUE SHIELD | Admitting: Sports Medicine

## 2017-01-30 DIAGNOSIS — Z0289 Encounter for other administrative examinations: Secondary | ICD-10-CM

## 2017-01-30 NOTE — Progress Notes (Deleted)
  OFFICE VISIT NOTE Antonio FellsMichael D. Delorise Shinerigby, DO  [facility] [place] [phone]  Antonio FlakeJames L Ward - 41 y.o. male MRN [id number]  Date of birth: 10/24/1975  Visit Date: 01/30/2017  PCP: Antonio CoveyBurchette, Antonio W, MD   Referred by: Antonio CoveyBurchette, Antonio W, MD  Stevenson ClinchBrandy Ward, CMA acting as scribe for Dr. Berline Choughigby.  SUBJECTIVE:  No chief complaint on file.  HPI: As below and per problem based documentation when appropriate.  [name] is an established patient presenting today in follow-up of LT hip pain. He was last seen 12/19/16 and had xray of the hip. He was advised to do home therapeutic exercises and Antonio Ward exercises.     ROS  Otherwise per HPI.  HISTORY & PERTINENT PRIOR DATA:  No specialty comments available. He reports that he quit smoking about 14 years ago. His smoking use included cigarettes. He has a 10.00 pack-year smoking history. he has never used smokeless tobacco. No results for input(s): HGBA1C, LABURIC, CREATINE in the last 8760 hours.  Invalid input(s): CR Allergies reviewed per EMR Prior to Admission medications   Medication Sig Start Date End Date Taking? Authorizing Provider  omeprazole (PRILOSEC) 20 MG capsule Take 20 mg by mouth daily.    [provider]   Patient Active Problem List   Diagnosis Date Noted  . Iliotibial band syndrome of left side 12/19/2016  . Obesity (BMI 30-39.9) 12/20/2013  . GERD (gastroesophageal reflux disease) 06/25/2012   Past Medical History:  Diagnosis Date  . Hay fever    Family History  Problem Relation Age of Onset  . Alcohol abuse Mother   . Depression Mother   . Heart disease Father   . Hypertension Father    Past Surgical History:  Procedure Laterality Date  . right leg severely broken Right 1999   titanium rod, plate and 8 screws  . TONSILLECTOMY  1980  . WISDOM TOOTH EXTRACTION  1998   Social History   Occupational History  . Not on file  Tobacco Use  . Smoking status:  Former Smoker    Packs/day: 0.50    Years: 20.00    Pack years: 10.00    Types: Cigarettes    Last attempt to quit: 01/26/2003    Years since quitting: 14.0  . Smokeless tobacco: Never Used  Substance and Sexual Activity  . Alcohol use: No  . Drug use: No  . Sexual activity: Not on file    OBJECTIVE:  VS:  HT:    WT:   BMI:     BP:   HR: bpm  TEMP: ( )  RESP:  EXAM: No additional findings.   RADIOLOGY: DG HIP UNILAT W OR W/O PELVIS 2-3 VIEWS LEFT CLINICAL DATA:  Six months of a an aching pain in the left hip. No known injury.  EXAM: DG HIP (WITH OR WITHOUT PELVIS) 2-3V LEFT  COMPARISON:  None in PACs  FINDINGS: The bony pelvis is subjectively adequately mineralized. There is no lytic nor blastic lesion. There is moderate narrowing of the left hip joint space. The articular surfaces of the left femoral head and acetabulum remains smoothly rounded. The femoral neck, intertrochanteric, and subtrochanteric regions are normal.  IMPRESSION: There is no acute bony abnormality of the left hip. There is moderate symmetric joint space loss consistent with osteoarthritis.  Electronically Signed   By: David  SwazilandJordan M.D.   On: 12/19/2016 13:29  ASSESSMENT & PLAN:  { }

## 2017-02-27 ENCOUNTER — Ambulatory Visit (HOSPITAL_COMMUNITY): Payer: BLUE CROSS/BLUE SHIELD | Admitting: Psychiatry

## 2017-08-16 ENCOUNTER — Encounter: Payer: Self-pay | Admitting: Family Medicine

## 2017-08-16 ENCOUNTER — Ambulatory Visit: Payer: BLUE CROSS/BLUE SHIELD | Admitting: Family Medicine

## 2017-08-16 VITALS — BP 102/68 | HR 77 | Temp 97.6°F | Wt 245.9 lb

## 2017-08-16 DIAGNOSIS — R1032 Left lower quadrant pain: Secondary | ICD-10-CM | POA: Diagnosis not present

## 2017-08-16 NOTE — Progress Notes (Signed)
  Subjective:     Patient ID: Antonio Ward, male   DOB: 07-25-1975, 42 y.o.   MRN: [id number]  HPI Patient seen with some intermittent left groin pain. He noted onset around last Thursday or Friday. He started back with more diligent exercise recently but denies specific injury. Has been doing a lot of weightlifting and some abdominal crunches. His pain was from the right groin and radiating occasionally toward the scrotum. He did not notice any swelling or hernia. He had some tenderness along the superior pole of the testicle. No burning with urination. No penile discharge. No fevers or chills.  Currently pain-free and has not had any pain for the past couple of days. He's had past history of some left buttock and hip pain but this pain is different. No pain with ambulation.  He had x-ray with sports medicine last fall with some mild to moderate osteoarthritis changes of the hip  Past Medical History:  Diagnosis Date  . Hay fever    Past Surgical History:  Procedure Laterality Date  . right leg severely broken Right 1999   titanium rod, plate and 8 screws  . TONSILLECTOMY  1980  . WISDOM TOOTH EXTRACTION  1998    reports that he quit smoking about 14 years ago. His smoking use included cigarettes. He has a 10.00 pack-year smoking history. He has never used smokeless tobacco. He reports that he does not drink alcohol or use drugs. family history includes Alcohol abuse in his mother; Depression in his mother; Heart disease in his father; Hypertension in his father. No Known Allergies   Review of Systems  Constitutional: Negative for chills and fever.  Hematological: Negative for adenopathy.       Objective:   Physical Exam  Constitutional: He appears well-developed and well-nourished.  Genitourinary:  Genitourinary Comments: Testes are normal bilaterally. No masses. No hernia.  Musculoskeletal:  Good range of motion left hip. No lateral hip tenderness.       Assessment:      Left groin pain. Currently resolved. He described what sounds to possible noninfectious epididymitis type pain in scrotal area.  Exam unremarkable at this time.    Plan:     -Discussed good scrotal support with briefs -Consider over-the-counter anti-inflammatory for any recurrent pain -Resume his exercise and weightlifting program and follow-up for any recurrent issues  Kristian Covey MD [facility]

## 2017-08-16 NOTE — Patient Instructions (Signed)
For any recurrent pain try some OTC non-steroidal and good support briefs  Touch base for recurrent pain.

## 2017-08-17 ENCOUNTER — Encounter: Payer: Self-pay | Admitting: Physician Assistant

## 2017-08-22 ENCOUNTER — Encounter: Payer: Self-pay | Admitting: Physician Assistant

## 2017-08-22 ENCOUNTER — Ambulatory Visit: Payer: BLUE CROSS/BLUE SHIELD | Admitting: Physician Assistant

## 2017-08-22 VITALS — BP 98/66 | HR 79 | Ht 72.0 in | Wt 247.4 lb

## 2017-08-22 DIAGNOSIS — K625 Hemorrhage of anus and rectum: Secondary | ICD-10-CM | POA: Diagnosis not present

## 2017-08-22 DIAGNOSIS — K640 First degree hemorrhoids: Secondary | ICD-10-CM

## 2017-08-22 MED ORDER — HYDROCORTISONE ACETATE 25 MG RE SUPP: 25.0000 mg | Freq: Two times a day (BID) | RECTAL | 1 refills | Status: DC

## 2017-08-22 NOTE — Patient Instructions (Addendum)
We sent a prescription for Anusol HC Suppositories to [facility] [address], [place], [place].  If you find this too expensive,  You can get over the counter, Glycerin suppositories and Hydrocortisone cream.  You can add the cream to the suppository before inserting in the rectum.    Follow up with either Hyacinth Meeker PA, or Dr. Rhea Belton as needed.   If you are age 44 or younger, your body mass index should be between 19-25. Your Body mass index is 33.55 kg/m. If this is out of the aformentioned range listed, please consider follow up with your Primary Care Provider.

## 2017-08-22 NOTE — Progress Notes (Addendum)
Chief Complaint: Rectal Bleeding  HPI:  Mr. Antonio Ward is a 42 year old Caucasian male with a past medical history as listed below,  who was referred to me by Kristian Covey, MD for a complaint of Rectal bleeding .      Today, presents to clinic with his wife who is a Engineer, civil (consulting), explains that he saw his PCP on 08/16/17 for left groin/testicle pain which was thought related to a strain/pull. That day when returning from his office visit there he had a bowel movement with some bright red blood in it. (pictures available) This occurred one other time that day and since then has not happened over the past week. Describes that the pain in his left scrotum did come back yesterday, but he admits to [place] the river and pulling himself in a weird way. Denies any change in his bowel habits which are soft solid and" do not taken long" according to his wife, on a daily basis.    Denies family history of colon cancer or IBD, previous history of rectal bleeding, weight loss, fever, chills, dizziness or symptoms that awaken him from sleep.  Past Medical History:  Diagnosis Date  . Hay fever     Past Surgical History:  Procedure Laterality Date  . right leg severely broken Right 1999   titanium rod, plate and 8 screws  . TONSILLECTOMY  1980  . WISDOM TOOTH EXTRACTION  1998    Current Outpatient Medications  Medication Sig Dispense Refill  . omeprazole (PRILOSEC) 20 MG capsule Take 20 mg by mouth daily.     No current facility-administered medications for this visit.     Allergies as of 08/22/2017  . (No Known Allergies)    Family History  Problem Relation Age of Onset  . Alcohol abuse Mother   . Depression Mother   . Heart disease Father   . Hypertension Father     Social History   Socioeconomic History  . Marital status: Married    Spouse name: Not on file  . Number of children: Not on file  . Years of education: Not on file  . Highest education level: Not on file  Occupational  History  . Not on file  Social Needs  . Financial resource strain: Not on file  . Food insecurity:    Worry: Not on file    Inability: Not on file  . Transportation needs:    Medical: Not on file    Non-medical: Not on file  Tobacco Use  . Smoking status: Former Smoker    Packs/day: 0.50    Years: 20.00    Pack years: 10.00    Types: Cigarettes    Last attempt to quit: 01/26/2003    Years since quitting: 14.5  . Smokeless tobacco: Never Used  Substance and Sexual Activity  . Alcohol use: No  . Drug use: No  . Sexual activity: Not on file  Lifestyle  . Physical activity:    Days per week: Not on file    Minutes per session: Not on file  . Stress: Not on file  Relationships  . Social connections:    Talks on phone: Not on file    Gets together: Not on file    Attends religious service: Not on file    Active member of club or organization: Not on file    Attends meetings of clubs or organizations: Not on file    Relationship status: Not on file  . Intimate partner violence:  Fear of current or ex partner: Not on file    Emotionally abused: Not on file    Physically abused: Not on file    Forced sexual activity: Not on file  Other Topics Concern  . Not on file  Social History Narrative  . Not on file    Review of Systems:    Constitutional: No weight loss, fever or chills Skin: No rash Cardiovascular: No chest pain Respiratory: No SOB Gastrointestinal: See HPI and otherwise negative Genitourinary: No dysuria  Neurological: No headache, dizziness or syncope Musculoskeletal: No new muscle or joint pain Hematologic: No bruising Psychiatric: No history of depression or anxiety    Physical Exam:  Vital signs: BP 98/66   Pulse 79   Ht 6' (1.829 m)   Wt 247 lb 6.4 oz (112.2 kg)   BMI 33.55 kg/m    Constitutional:   Pleasant Caucasian male appears to be in NAD, Well developed, Well nourished, alert and cooperative Head:  Normocephalic and atraumatic. Eyes:    PEERL, EOMI. No icterus. Conjunctiva pink. Ears:  Normal auditory acuity. Neck:  Supple Throat: Oral cavity and pharynx without inflammation, swelling or lesion.  Respiratory: Respirations even and unlabored. Lungs clear to auscultation bilaterally.   No wheezes, crackles, or rhonchi.  Cardiovascular: Normal S1, S2. No MRG. Regular rate and rhythm. No peripheral edema, cyanosis or pallor.  Gastrointestinal:  Soft, nondistended, nontender. No rebound or guarding. Normal bowel sounds. No appreciable masses or hepatomegaly. Rectal:  External: No fissure, hemorrhoids or mass; internal: No fissure, no mass, no discharge; Anoscopy: limited exam due to patient's increased sphincter tone and movement, visualization of grade 1 internal hemorrhoids  Msk:  Symmetrical without gross deformities. Without edema, no deformity or joint abnormality.  Neurologic:  Alert and  oriented x4;  grossly normal neurologically.  Skin:   Dry and intact without significant lesions or rashes. Psychiatric: Demonstrates good judgement and reason without abnormal affect or behaviors.  NO recent labs or imaging.  Assessment: 1. Rectal bleeding:On one day, 2 instances, interestingly with a groin pain-thought unrelated, patient does admit to increased exercise with increased intra-abdominal pressure recently, no change in bowel habits, no repeat bleeding since then, likely related to internal hemorrhoids seen today 2. Internal hemorrhoids   Plan: 1. Prescribed Anusol suppositories twice a day 7 days with one refill 2. Discussed anti-hemorrhoidal measures with the patient today. 3. Explained to the patient and if he has any further bleeding in the future or develops a change in bowel habits or any abdominal pain, recommend he have a colonoscopy for further evaluation. He verbalized understanding. 4. In regards to patient's scrotal pain, still consider this could be related to straining/pulling with exercise, but if this  continues would recommend he see his PCP and possibly have a scrotal ultrasound 5. Patient to follow in clinic with [place] as needed in the future.  Hyacinth Meeker, PA-C [facility] 08/22/2017, 1:28 PM  Cc: Kristian Covey, MD   Addendum: Reviewed and agree with initial management. Pyrtle, Carie Caddy, MD

## 2017-10-23 DIAGNOSIS — N451 Epididymitis: Secondary | ICD-10-CM | POA: Diagnosis not present

## 2017-12-26 DIAGNOSIS — N451 Epididymitis: Secondary | ICD-10-CM | POA: Diagnosis not present

## 2017-12-29 ENCOUNTER — Ambulatory Visit: Payer: BLUE CROSS/BLUE SHIELD | Admitting: Physician Assistant

## 2017-12-29 ENCOUNTER — Encounter: Payer: Self-pay | Admitting: Physician Assistant

## 2017-12-29 ENCOUNTER — Encounter (INDEPENDENT_AMBULATORY_CARE_PROVIDER_SITE_OTHER): Payer: Self-pay

## 2017-12-29 VITALS — BP 110/78 | HR 86 | Ht 72.0 in | Wt 255.0 lb

## 2017-12-29 DIAGNOSIS — R0789 Other chest pain: Secondary | ICD-10-CM | POA: Diagnosis not present

## 2017-12-29 NOTE — Patient Instructions (Signed)
If you are age 44 or older, your body mass index should be between 23-30. Your Body mass index is 34.58 kg/m. If this is out of the aforementioned range listed, please consider follow up with your Primary Care Provider.  If you are age 62 or younger, your body mass index should be between 19-25. Your Body mass index is 34.58 kg/m. If this is out of the aformentioned range listed, please consider follow up with your Primary Care Provider.   Increase Omeprazole 20 mg twice daily 20-30 minutes before breakfast and dinner.  Follow up with Dr. Rhea Belton as needed.  Thank you for choosing me and [facility].  Hyacinth Meeker, PA-C

## 2017-12-29 NOTE — Progress Notes (Addendum)
Chief Complaint: Atypical chest pain  HPI:    Antonio Ward is a 42 year old Caucasian male with a past medical history as listed below, assigned to Dr. Rhea Belton at last visit, who presents to clinic today with a complaint of reflux.    08/22/2017 patient seen in clinic by me for rectal bleeding.  Patient was given Anusol suppositories.    Today, patient explains that back in 2012 he went on an early morning plane ride he developed some chest pain/pressure during that flight, when he got back home he saw his PCP and had a full cardiac work-up including stress test and this was all negative.  Patient had chest pain again and proceeded to the ER and was diagnosed with reflux.  Started on Omeprazole 20 mg daily which he has been taking over the past 6 years.  Tells me he feels as though this helps the symptoms about 90% of the time, but recently has had some increase in this chest tightness.  Describes that sometimes this just occurs when he is driving out of the blue and he gets a tight chest/shortness of breath.  This also seems worse when he goes up and down and elevation at all.  He drives trucks for work and when he goes through [employer] he feels as though this pressure is some worse.  Root beer helps to alleviate this pressure at times.  Does admit that sometimes this wakes him up from his sleep.  Denies eating late at night.    Explains that his rectal bleeding has stopped.    Does tell me that he works full-time, his wife works full-time and they have 4 kids, so his life is stressful, but "I am not a stressed person".    Denies fever, chills, weight loss, anorexia, nausea, vomiting, heartburn, reflux or abdominal pain.     Past Medical History:  Diagnosis Date  . GERD (gastroesophageal reflux disease)     Past Surgical History:  Procedure Laterality Date  . right leg severely broken Right 1999   titanium rod, plate and 8 screws  . TONSILLECTOMY  1980  . WISDOM TOOTH EXTRACTION  1998     Current Outpatient Medications  Medication Sig Dispense Refill  . omeprazole (PRILOSEC) 20 MG capsule Take 20 mg by mouth daily.     No current facility-administered medications for this visit.     Allergies as of 12/29/2017  . (No Known Allergies)    Family History  Problem Relation Age of Onset  . Alcohol abuse Mother   . Depression Mother   . Heart disease Father   . Hypertension Father   . Cervical cancer Maternal Grandmother   . Cancer Paternal Grandmother        cervical ??  . Colon cancer Neg Hx   . Esophageal cancer Neg Hx     Social History   Socioeconomic History  . Marital status: Married    Spouse name: Not on file  . Number of children: Not on file  . Years of education: Not on file  . Highest education level: Not on file  Occupational History  . Not on file  Social Needs  . Financial resource strain: Not on file  . Food insecurity:    Worry: Not on file    Inability: Not on file  . Transportation needs:    Medical: Not on file    Non-medical: Not on file  Tobacco Use  . Smoking status: Former Smoker  Packs/day: 0.50    Years: 20.00    Pack years: 10.00    Types: Cigarettes    Last attempt to quit: 01/26/2003    Years since quitting: 14.9  . Smokeless tobacco: Never Used  Substance and Sexual Activity  . Alcohol use: No  . Drug use: No  . Sexual activity: Yes  Lifestyle  . Physical activity:    Days per week: Not on file    Minutes per session: Not on file  . Stress: Not on file  Relationships  . Social connections:    Talks on phone: Not on file    Gets together: Not on file    Attends religious service: Not on file    Active member of club or organization: Not on file    Attends meetings of clubs or organizations: Not on file    Relationship status: Not on file  . Intimate partner violence:    Fear of current or ex partner: Not on file    Emotionally abused: Not on file    Physically abused: Not on file    Forced sexual  activity: Not on file  Other Topics Concern  . Not on file  Social History Narrative  . Not on file    Review of Systems:    Constitutional: No weight loss, fever or chills Cardiovascular: +chest pressure Respiratory: No SOB  Gastrointestinal: See HPI and otherwise negative  Physical Exam:  Vital signs: BP 110/78   Pulse 86   Ht 6' (1.829 m)   Wt 255 lb (115.7 kg)   BMI 34.58 kg/m   Constitutional:   Pleasant overweight Caucasian male appears to be in NAD, Well developed, Well nourished, alert and cooperative Respiratory: Respirations even and unlabored. Lungs clear to auscultation bilaterally.   No wheezes, crackles, or rhonchi.  Cardiovascular: Normal S1, S2. No MRG. Regular rate and rhythm. No peripheral edema, cyanosis or pallor.  Gastrointestinal:  Soft, nondistended, nontender. No rebound or guarding. Normal bowel sounds. No appreciable masses or hepatomegaly. Rectal:  Not performed.  Psychiatric: Demonstrates good judgement and reason without abnormal affect or behaviors.  No recent labs or imaging.  Assessment: 1.  Atypical chest pain: Full cardiac work-up in the past which was negative, did have some relief with Omeprazole 20 mg daily, now having some breakthrough symptoms; most likely this is silent reflux/esophageal spasm  Plan: 1.  Discussed that the patient could have an EGD for further evaluation but he declines at this time. 2.  Increased patient's Omeprazole to 20 mg twice daily, 20-30 minutes before breakfast and dinner.  Offered him a prescription but patient prefers to buy this over-the-counter. 3.  Answered all the patient's questions.  If his symptoms get worse would recommend he have further work-up. 4.  Patient to follow in clinic with Dr. Rhea Belton or myself as needed in the future.  Hyacinth Meeker, PA-C [facility] 12/29/2017, 11:12 AM  Cc: Kristian Covey, MD   Addendum: Reviewed and agree with assessment and management  plan. Pyrtle, Carie Caddy, MD

## 2018-01-04 ENCOUNTER — Telehealth: Payer: Self-pay | Admitting: Physician Assistant

## 2018-01-04 MED ORDER — OMEPRAZOLE 20 MG PO CPDR: 20.0000 mg | DELAYED_RELEASE_CAPSULE | Freq: Every day | ORAL | 3 refills | Status: DC

## 2018-01-04 NOTE — Telephone Encounter (Signed)
Rx sent to pharmacy   

## 2018-01-04 NOTE — Telephone Encounter (Signed)
Pt would like a prescription for [facility]  [address] He mentioned that Victorino Dike offered it on his last visit and he said that he preferred otc but he changed his mind.

## 2018-01-11 ENCOUNTER — Telehealth: Payer: Self-pay | Admitting: Physician Assistant

## 2018-01-11 MED ORDER — OMEPRAZOLE MAGNESIUM 20 MG PO TBEC: 20.0000 mg | DELAYED_RELEASE_TABLET | Freq: Every day | ORAL | 3 refills | Status: DC

## 2018-01-11 NOTE — Telephone Encounter (Signed)
Left detailed message on patients voicemail and Rx sent to pharmacy.

## 2018-01-18 DIAGNOSIS — N4342 Spermatocele of epididymis, multiple: Secondary | ICD-10-CM | POA: Diagnosis not present

## 2018-04-10 ENCOUNTER — Encounter: Payer: BLUE CROSS/BLUE SHIELD | Admitting: Family Medicine

## 2018-04-13 ENCOUNTER — Ambulatory Visit (INDEPENDENT_AMBULATORY_CARE_PROVIDER_SITE_OTHER): Payer: BLUE CROSS/BLUE SHIELD | Admitting: Family Medicine

## 2018-04-13 ENCOUNTER — Other Ambulatory Visit: Payer: Self-pay

## 2018-04-13 ENCOUNTER — Encounter: Payer: Self-pay | Admitting: Family Medicine

## 2018-04-13 VITALS — BP 120/80 | HR 83 | Temp 97.6°F | Ht 71.5 in | Wt 259.6 lb

## 2018-04-13 DIAGNOSIS — Z0000 Encounter for general adult medical examination without abnormal findings: Secondary | ICD-10-CM | POA: Diagnosis not present

## 2018-04-13 LAB — CBC WITH DIFFERENTIAL/PLATELET
BASOS ABS: 0.1 10*3/uL (ref 0.0–0.1)
Basophils Relative: 2.2 % (ref 0.0–3.0)
Eosinophils Absolute: 0.2 10*3/uL (ref 0.0–0.7)
Eosinophils Relative: 5.3 % — ABNORMAL HIGH (ref 0.0–5.0)
HCT: 48.6 % (ref 39.0–52.0)
HEMOGLOBIN: 16.5 g/dL (ref 13.0–17.0)
LYMPHS ABS: 1.2 10*3/uL (ref 0.7–4.0)
Lymphocytes Relative: 26 % (ref 12.0–46.0)
MCHC: 34 g/dL (ref 30.0–36.0)
MCV: 83.7 fl (ref 78.0–100.0)
Monocytes Absolute: 0.4 10*3/uL (ref 0.1–1.0)
Monocytes Relative: 9.4 % (ref 3.0–12.0)
NEUTROS PCT: 57.1 % (ref 43.0–77.0)
Neutro Abs: 2.6 10*3/uL (ref 1.4–7.7)
Platelets: 294 10*3/uL (ref 150.0–400.0)
RBC: 5.81 Mil/uL (ref 4.22–5.81)
RDW: 14.1 % (ref 11.5–15.5)
WBC: 4.6 10*3/uL (ref 4.0–10.5)

## 2018-04-13 LAB — LIPID PANEL
Cholesterol: 145 mg/dL (ref 0–200)
HDL: 43.7 mg/dL (ref >39.00)
LDL Cholesterol: 88 mg/dL (ref 0–99)
NONHDL: 101.36
Total CHOL/HDL Ratio: 3
Triglycerides: 66 mg/dL (ref 0.0–149.0)
VLDL: 13.2 mg/dL (ref 0.0–40.0)

## 2018-04-13 LAB — HEPATIC FUNCTION PANEL
ALK PHOS: 62 U/L (ref 39–117)
ALT: 32 U/L (ref 0–53)
AST: 23 U/L (ref 0–37)
Albumin: 4.6 g/dL (ref 3.5–5.2)
BILIRUBIN DIRECT: 0.1 mg/dL (ref 0.0–0.3)
BILIRUBIN TOTAL: 0.7 mg/dL (ref 0.2–1.2)
Total Protein: 7.6 g/dL (ref 6.0–8.3)

## 2018-04-13 LAB — BASIC METABOLIC PANEL
BUN: 15 mg/dL (ref 6–23)
CALCIUM: 9.7 mg/dL (ref 8.4–10.5)
CO2: 24 mEq/L (ref 19–32)
Chloride: 103 mEq/L (ref 96–112)
Creatinine, Ser: 1.19 mg/dL (ref 0.40–1.50)
GFR: 66.3 mL/min (ref >60.00)
Glucose, Bld: 96 mg/dL (ref 70–99)
Potassium: 4.4 mEq/L (ref 3.5–5.1)
SODIUM: 137 meq/L (ref 135–145)

## 2018-04-13 LAB — TSH: TSH: 2.5 u[IU]/mL (ref 0.35–4.50)

## 2018-04-13 NOTE — Progress Notes (Signed)
Subjective:     Patient ID: Antonio Ward, male   DOB: [birth year], 43 y.o.   MRN: [id number]  HPI Patient is seen for physical exam.  He is generally very healthy.  He has had some reflux which is currently controlled with over-the-counter Prilosec 40 mg daily.  Has had some weight gain during the past year.  He just recently started back exercising and has some plans to try to drop about 20 to 30 pounds.  Question of sleep apnea.  Snores a lot at night and his wife has had some observed apnea episodes.  Does not have any significant daytime somnolence or fatigue.  He declines flu vaccine.  He states he had lab work done at work this past year but he would like to get labs today.  Past Medical History:  Diagnosis Date  . GERD (gastroesophageal reflux disease)    Past Surgical History:  Procedure Laterality Date  . right leg severely broken Right 1999   titanium rod, plate and 8 screws  . TONSILLECTOMY  1980  . WISDOM TOOTH EXTRACTION  1998    reports that he quit smoking about 15 years ago. His smoking use included cigarettes. He has a 10.00 pack-year smoking history. He has never used smokeless tobacco. He reports that he does not drink alcohol or use drugs. family history includes Alcohol abuse in his mother; Cancer in his paternal grandmother; Cervical cancer in his maternal grandmother; Depression in his mother; Heart disease in his father; Hypertension in his father. No Known Allergies   Review of Systems  Constitutional: Negative for activity change, appetite change, fatigue and fever.  HENT: Negative for congestion, ear pain and trouble swallowing.   Eyes: Negative for pain and visual disturbance.  Respiratory: Negative for cough, shortness of breath and wheezing.   Cardiovascular: Negative for chest pain and palpitations.  Gastrointestinal: Negative for abdominal distention, abdominal pain, blood in stool, constipation, diarrhea, nausea, rectal pain and vomiting.  Genitourinary:  Negative for dysuria, hematuria and testicular pain.  Musculoskeletal: Negative for arthralgias and joint swelling.  Skin: Negative for rash.  Neurological: Negative for dizziness, syncope and headaches.  Hematological: Negative for adenopathy.  Psychiatric/Behavioral: Negative for confusion and dysphoric mood.       Objective:   Physical Exam Constitutional:      General: He is not in acute distress.    Appearance: He is well-developed.  HENT:     Head: Normocephalic and atraumatic.     Right Ear: External ear normal.     Left Ear: External ear normal.  Eyes:     Conjunctiva/sclera: Conjunctivae normal.     Pupils: Pupils are equal, round, and reactive to light.  Neck:     Musculoskeletal: Normal range of motion and neck supple.     Thyroid: No thyromegaly.  Cardiovascular:     Rate and Rhythm: Normal rate and regular rhythm.     Heart sounds: Normal heart sounds. No murmur.  Pulmonary:     Effort: No respiratory distress.     Breath sounds: No wheezing or rales.  Abdominal:     General: Bowel sounds are normal. There is no distension.     Palpations: Abdomen is soft. There is no mass.     Tenderness: There is no abdominal tenderness. There is no guarding or rebound.  Lymphadenopathy:     Cervical: No cervical adenopathy.  Skin:    Findings: No rash.  Neurological:     Mental Status: He is alert and  oriented to person, place, and time.     Cranial Nerves: No cranial nerve deficit.     Deep Tendon Reflexes: Reflexes normal.        Assessment:     Physical exam.  The following health maintenance issues were addressed    Plan:     -Offered flu vaccine and he declines -Obtain screening labs -We recommended that he try to drop some weight hopefully at least 20 to 30 pounds over the next year and establish more consistent exercise -Discussed the evaluation of sleep apnea and at this point he wishes to wait  Kristian Covey MD [facility] at  [facility]

## 2018-04-13 NOTE — Patient Instructions (Signed)
Try to lose some weight  Let me know if you develop any increased daytime fatigue or sleepiness and we can get sleep study.

## 2019-05-23 ENCOUNTER — Ambulatory Visit: Payer: Commercial Managed Care - PPO | Attending: Internal Medicine

## 2019-05-23 DIAGNOSIS — Z23 Encounter for immunization: Secondary | ICD-10-CM | POA: Insufficient documentation

## 2019-05-23 NOTE — Progress Notes (Signed)
   Covid-19 Vaccination Clinic  Name:  Antonio Ward    MRN: [id number] DOB: 1975-04-18  05/23/2019  Antonio Ward was observed post Covid-19 immunization for 15 minutes without incidence. He was provided with Vaccine Information Sheet and instruction to access the V-Safe system.   Antonio Ward was instructed to call 911 with any severe reactions post vaccine: Marland Kitchen Difficulty breathing  . Swelling of your face and throat  . A fast heartbeat  . A bad rash all over your body  . Dizziness and weakness    Immunizations Administered    Name Date Dose VIS Date Route   Pfizer COVID-19 Vaccine 05/23/2019  5:05 PM 0.3 mL 03/08/2019 Intramuscular   Manufacturer: [employer], [employer]   Lot: [id number]   NDC: [address]-4

## 2019-06-07 ENCOUNTER — Other Ambulatory Visit: Payer: Self-pay

## 2019-06-07 ENCOUNTER — Encounter: Payer: Self-pay | Admitting: Family Medicine

## 2019-06-07 ENCOUNTER — Ambulatory Visit (INDEPENDENT_AMBULATORY_CARE_PROVIDER_SITE_OTHER): Payer: Commercial Managed Care - PPO | Admitting: Family Medicine

## 2019-06-07 VITALS — BP 110/76 | HR 85 | Temp 97.6°F | Ht 71.5 in | Wt 265.0 lb

## 2019-06-07 DIAGNOSIS — Z0000 Encounter for general adult medical examination without abnormal findings: Secondary | ICD-10-CM | POA: Diagnosis not present

## 2019-06-07 LAB — BASIC METABOLIC PANEL
BUN: 16 mg/dL (ref 6–23)
CO2: 30 mEq/L (ref 19–32)
Calcium: 9.5 mg/dL (ref 8.4–10.5)
Chloride: 102 mEq/L (ref 96–112)
Creatinine, Ser: 1.11 mg/dL (ref 0.40–1.50)
GFR: 71.47 mL/min (ref >60.00)
Glucose, Bld: 103 mg/dL — ABNORMAL HIGH (ref 70–99)
Potassium: 4.5 mEq/L (ref 3.5–5.1)
Sodium: 138 mEq/L (ref 135–145)

## 2019-06-07 LAB — CBC WITH DIFFERENTIAL/PLATELET
Basophils Absolute: 0.1 10*3/uL (ref 0.0–0.1)
Basophils Relative: 2.9 % (ref 0.0–3.0)
Eosinophils Absolute: 0.3 10*3/uL (ref 0.0–0.7)
Eosinophils Relative: 6.6 % — ABNORMAL HIGH (ref 0.0–5.0)
HCT: 46.8 % (ref 39.0–52.0)
Hemoglobin: 15.7 g/dL (ref 13.0–17.0)
Lymphocytes Relative: 20.8 % (ref 12.0–46.0)
Lymphs Abs: 1 10*3/uL (ref 0.7–4.0)
MCHC: 33.5 g/dL (ref 30.0–36.0)
MCV: 85.5 fl (ref 78.0–100.0)
Monocytes Absolute: 0.5 10*3/uL (ref 0.1–1.0)
Monocytes Relative: 10.6 % (ref 3.0–12.0)
Neutro Abs: 2.7 10*3/uL (ref 1.4–7.7)
Neutrophils Relative %: 59.1 % (ref 43.0–77.0)
Platelets: 279 10*3/uL (ref 150.0–400.0)
RBC: 5.47 Mil/uL (ref 4.22–5.81)
RDW: 14.5 % (ref 11.5–15.5)
WBC: 4.6 10*3/uL (ref 4.0–10.5)

## 2019-06-07 LAB — LIPID PANEL
Cholesterol: 147 mg/dL (ref 0–200)
HDL: 45 mg/dL (ref >39.00)
LDL Cholesterol: 87 mg/dL (ref 0–99)
NonHDL: 101.66
Total CHOL/HDL Ratio: 3
Triglycerides: 74 mg/dL (ref 0.0–149.0)
VLDL: 14.8 mg/dL (ref 0.0–40.0)

## 2019-06-07 LAB — HEPATIC FUNCTION PANEL
ALT: 42 U/L (ref 0–53)
AST: 31 U/L (ref 0–37)
Albumin: 4.4 g/dL (ref 3.5–5.2)
Alkaline Phosphatase: 59 U/L (ref 39–117)
Bilirubin, Direct: 0.1 mg/dL (ref 0.0–0.3)
Total Bilirubin: 0.6 mg/dL (ref 0.2–1.2)
Total Protein: 7.6 g/dL (ref 6.0–8.3)

## 2019-06-07 LAB — POCT URINALYSIS DIPSTICK
Glucose, UA: NEGATIVE
Leukocytes, UA: NEGATIVE
Protein, UA: POSITIVE — AB
Spec Grav, UA: 1.025 (ref 1.010–1.025)
Urobilinogen, UA: 0.2 E.U./dL
pH, UA: 6 (ref 5.0–8.0)

## 2019-06-07 LAB — TSH: TSH: 2.41 u[IU]/mL (ref 0.35–4.50)

## 2019-06-07 NOTE — Progress Notes (Signed)
Subjective:     Patient ID: Antonio Ward, male   DOB: 22-Aug-1975, 44 y.o.   MRN: [id number]  HPI   Antonio Ward is here for physical exam.  He has history of reflux which has been fairly well controlled with omeprazole.  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  He is married and has couple of children.  He smoked until was in his 54s.  No regular alcohol use.  Generally doing well.  Recently started back exercising some.  Small cystic lesion right axillary region which has been present for several months.  Basically asymptomatic.  He does have some bilateral lower thoracic back pains which are worse with movement.    Past Medical History:  Diagnosis Date  . GERD (gastroesophageal reflux disease)    Past Surgical History:  Procedure Laterality Date  . right leg severely broken Right 1999   titanium rod, plate and 8 screws  . TONSILLECTOMY  1980  . [place]    reports that he quit smoking about 16 years ago. His smoking use included cigarettes. He has a 10.00 pack-year smoking history. He has never used smokeless tobacco. He reports that he does not drink alcohol or use drugs. family history includes Alcohol abuse in his mother; Cancer in his paternal grandmother; Cervical cancer in his maternal grandmother; Depression in his mother; Heart disease in his father; Hypertension in his father. No Known Allergies   Review of Systems  Constitutional: Negative for activity change, appetite change, fatigue and fever.  HENT: Negative for congestion, ear pain and trouble swallowing.   Eyes: Negative for pain and visual disturbance.  Respiratory: Negative for cough, shortness of breath and wheezing.   Cardiovascular: Negative for chest pain and palpitations.  Gastrointestinal: Negative for abdominal distention, abdominal pain, blood in stool, constipation, diarrhea, nausea, rectal pain and vomiting.  Genitourinary:  Negative for dysuria, hematuria and testicular pain.  Musculoskeletal: Positive for back pain. Negative for arthralgias and joint swelling.  Skin: Negative for rash.  Neurological: Negative for dizziness, syncope and headaches.  Hematological: Negative for adenopathy.  Psychiatric/Behavioral: Negative for confusion and dysphoric mood.       Objective:   Physical Exam Constitutional:      General: He is not in acute distress.    Appearance: He is well-developed.  HENT:     Head: Normocephalic and atraumatic.     Right Ear: External ear normal.     Left Ear: External ear normal.  Eyes:     Conjunctiva/sclera: Conjunctivae normal.     Pupils: Pupils are equal, round, and reactive to light.  Neck:     Thyroid: No thyromegaly.  Cardiovascular:     Rate and Rhythm: Normal rate and regular rhythm.     Heart sounds: Normal heart sounds. No murmur.  Pulmonary:     Effort: No respiratory distress.     Breath sounds: No wheezing or rales.  Abdominal:     General: Bowel sounds are normal. There is no distension.     Palpations: Abdomen is soft. There is no mass.     Tenderness: There is no abdominal tenderness. There is no guarding or rebound.  Musculoskeletal:     Cervical back: Normal range of motion and neck supple.  Lymphadenopathy:     Cervical: No cervical adenopathy.  Skin:    Findings: No rash.     Comments: Small cystic type lesion in  the right axilla.  No overlying erythema.  Nontender.  Nonfluctuant.  This is just under the skin and about 1/2 cm diameter  Neurological:     Mental Status: He is alert and oriented to person, place, and time.     Cranial Nerves: No cranial nerve deficit.     Deep Tendon Reflexes: Reflexes normal.        Assessment:     Physical exam.  Has probable small epidermal cyst right axillary region.  No signs of secondary infection.  Suspect musculoskeletal low back pain.    Plan:     -Check screening labs.  Will include urinalysis with his  back pain history. -Recommend try to lose some weight and continue with regular exercise habits -He does need tetanus booster with plans to get his second Covid vaccine within the next couple weeks so we decided to wait on tetanus booster at this time  Kristian Covey MD [facility]

## 2019-06-07 NOTE — Patient Instructions (Signed)
Epidermal Cyst  An epidermal cyst is a sac made of skin tissue. The sac contains a substance called keratin. Keratin is a protein that is normally secreted through the hair follicles. When keratin becomes trapped in the top layer of skin (epidermis), it can form an epidermal cyst. Epidermal cysts can be found anywhere on your body. These cysts are usually harmless (benign), and they may not cause symptoms unless they become infected. What are the causes? This condition may be caused by:  A blocked hair follicle.  A hair that curls and re-enters the skin instead of growing straight out of the skin (ingrown hair).  A blocked pore.  Irritated skin.  An injury to the skin.  Certain conditions that are passed along from parent to child (inherited).  Human papillomavirus (HPV).  Long-term (chronic) sun damage to the skin. What increases the risk? The following factors may make you more likely to develop an epidermal cyst:  Having acne.  Being overweight.  Being 30-40 years old. What are the signs or symptoms? The only symptom of this condition may be a small, painless lump underneath the skin. When an epidermal cyst ruptures, it may become infected. Symptoms may include:  Redness.  Inflammation.  Tenderness.  Warmth.  Fever.  Keratin draining from the cyst. Keratin is grayish-white, bad-smelling substance.  Pus draining from the cyst. How is this diagnosed? This condition is diagnosed with a physical exam.  In some cases, you may have a sample of tissue (biopsy) taken from your cyst to be examined under a microscope or tested for bacteria.  You may be referred to a health care provider who specializes in skin care (dermatologist). How is this treated? In many cases, epidermal cysts go away on their own without treatment. If a cyst becomes infected, treatment may include:  Opening and draining the cyst, done by a health care provider. After draining, minor surgery to  remove the rest of the cyst may be done.  Antibiotic medicine.  Injections of medicines (steroids) that help to reduce inflammation.  Surgery to remove the cyst. Surgery may be done if the cyst: ? Becomes large. ? Bothers you. ? Has a chance of turning into cancer.  Do not try to open a cyst yourself. Follow these instructions at home:  Take over-the-counter and prescription medicines only as told by your health care provider.  If you were prescribed an antibiotic medicine, take it it as told by your health care provider. Do not stop using the antibiotic even if you start to feel better.  Keep the area around your cyst clean and dry.  Wear loose, dry clothing.  Avoid touching your cyst.  Check your cyst every day for signs of infection. Check for: ? Redness, swelling, or pain. ? Fluid or blood. ? Warmth. ? Pus or a bad smell.  Keep all follow-up visits as told by your health care provider. This is important. How is this prevented?  Wear clean, dry, clothing.  Avoid wearing tight clothing.  Keep your skin clean and dry. Take showers or baths every day. Contact a health care provider if:  Your cyst develops symptoms of infection.  Your condition is not improving or is getting worse.  You develop a cyst that looks different from other cysts you have had.  You have a fever. Get help right away if:  Redness spreads from the cyst into the surrounding area. Summary  An epidermal cyst is a sac made of skin tissue. These cysts are   usually harmless (benign), and they may not cause symptoms unless they become infected.  If a cyst becomes infected, treatment may include surgery to open and drain the cyst, or to remove it. Treatment may also include medicines by mouth or through an injection.  Take over-the-counter and prescription medicines only as told by your health care provider. If you were prescribed an antibiotic medicine, take it as told by your health care  provider. Do not stop using the antibiotic even if you start to feel better.  Contact a health care provider if your condition is not improving or is getting worse.  Keep all follow-up visits as told by your health care provider. This is important. This information is not intended to replace advice given to you by your health care provider. Make sure you discuss any questions you have with your health care provider. Document Revised: 07/05/2018 Document Reviewed: 09/25/2017 Elsevier Patient Education  2020 Elsevier Inc.  

## 2019-06-18 ENCOUNTER — Ambulatory Visit: Payer: Commercial Managed Care - PPO | Attending: Internal Medicine

## 2019-06-18 DIAGNOSIS — Z23 Encounter for immunization: Secondary | ICD-10-CM

## 2019-06-18 NOTE — Progress Notes (Signed)
   Covid-19 Vaccination Clinic  Name:  Antonio Ward    MRN: [id number] DOB: [birth year]  06/18/2019  Antonio Ward was observed post Covid-19 immunization for 15 minutes without incident. He was provided with Vaccine Information Sheet and instruction to access the V-Safe system.   Antonio Ward was instructed to call 911 with any severe reactions post vaccine: Marland Kitchen Difficulty breathing  . Swelling of face and throat  . A fast heartbeat  . A bad rash all over body  . Dizziness and weakness   Immunizations Administered    Name Date Dose VIS Date Route   Pfizer COVID-19 Vaccine 06/18/2019  3:17 PM 0.3 mL 03/08/2019 Intramuscular   Manufacturer: [employer], [employer]   Lot: [id number]   NDC: [address]-2

## 2019-07-15 ENCOUNTER — Telehealth: Payer: Commercial Managed Care - PPO | Admitting: Family Medicine

## 2019-07-16 ENCOUNTER — Telehealth (INDEPENDENT_AMBULATORY_CARE_PROVIDER_SITE_OTHER): Payer: Commercial Managed Care - PPO | Admitting: Family Medicine

## 2019-07-16 ENCOUNTER — Other Ambulatory Visit: Payer: Self-pay

## 2019-07-16 DIAGNOSIS — J019 Acute sinusitis, unspecified: Secondary | ICD-10-CM | POA: Diagnosis not present

## 2019-07-16 NOTE — Progress Notes (Signed)
Patient ID: Antonio Ward, male   DOB: May 18, 1975, 44 y.o.   MRN: [id number]   This visit type was conducted due to national recommendations for restrictions regarding the COVID-19 pandemic in an effort to limit this patient's exposure and mitigate transmission in our community.   Virtual Visit via Telephone Note  I connected with Antonio Ward on 07/16/19 at  8:45 AM EDT by telephone and verified that I am speaking with the correct person using two identifiers.   I discussed the limitations, risks, security and privacy concerns of performing an evaluation and management service by telephone and the availability of in person appointments. I also discussed with the patient that there may be a patient responsible charge related to this service. The patient expressed understanding and agreed to proceed.  Location patient: home Location provider: work or home office Participants present for the call: patient, provider Patient did not have a visit in the prior 7 days to address this/these issue(s).   History of Present Illness: Antonio Ward called with about 2-week history of some thick sinus drainage with greenish-yellowish discharge.  He does not generally suffer allergies in the spring.  He had done some renovations in a [place] that they just acquired and thinks the dust may have stopped him up.  He tried over-the-counter things like Sudafed without relief.  He has already had both Covid vaccines.  No recent fever.  No significant cough.  Occasional headaches.  No sick exposures.   Observations/Objective: Patient sounds cheerful and well on the phone. I do not appreciate any SOB. Speech and thought processing are grossly intact. Patient reported vitals:  Assessment and Plan:  Probable acute sinusitis, non-recurrent  -Given duration of symptoms start Augmentin 875 mg twice daily with food -Follow-up in 1 to 2 weeks if not improved  Follow Up Instructions:  -As above   99441 5-10 99442  11-20 99443 21-30 I did not refer this patient for an OV in the next 24 hours for this/these issue(s).  I discussed the assessment and treatment plan with the patient. The patient was provided an opportunity to ask questions and all were answered. The patient agreed with the plan and demonstrated an understanding of the instructions.   The patient was advised to call back or seek an in-person evaluation if the symptoms worsen or if the condition fails to improve as anticipated.  I provided 12 minutes of non-face-to-face time during this encounter.   Evelena Peat, MD

## 2019-07-17 ENCOUNTER — Telehealth: Payer: Self-pay | Admitting: *Deleted

## 2019-07-17 MED ORDER — AMOXICILLIN-POT CLAVULANATE 875-125 MG PO TABS: 1.0000 | ORAL_TABLET | Freq: Two times a day (BID) | ORAL | 0 refills | Status: DC

## 2019-07-17 NOTE — Telephone Encounter (Signed)
Patient called after hours line. Patient reports he had a Televisit with the doctor at 8am this morning and they were going to call in medication for his sinus infection. Patient reported  He was  at the pharmacy now and they have not gotten the script yet.   Clinic RN does see where the PCP mentioned for him to start Augmentin 875 mg twice daily with food but do not see for how long.

## 2019-07-17 NOTE — Telephone Encounter (Signed)
Pt called in again in regards to medication that was suppose to be prescribed to him yesterday but it has not been sent to pharmacy as of it.

## 2019-07-17 NOTE — Addendum Note (Signed)
Addended by: Kristian Covey on: 07/17/2019 09:16 AM   Modules accepted: Orders

## 2019-07-17 NOTE — Telephone Encounter (Signed)
That is my fault.  I got distracted yesterday and forgot to send in .   rx has been sent.

## 2019-07-17 NOTE — Telephone Encounter (Signed)
Pt has been informed that medication has been sent in

## 2019-07-17 NOTE — Telephone Encounter (Signed)
This is has been sent to Dr.Burchette waiting for a response

## 2019-12-17 ENCOUNTER — Telehealth (INDEPENDENT_AMBULATORY_CARE_PROVIDER_SITE_OTHER): Payer: Commercial Managed Care - PPO | Admitting: Family Medicine

## 2019-12-17 ENCOUNTER — Encounter: Payer: Self-pay | Admitting: Family Medicine

## 2019-12-17 ENCOUNTER — Other Ambulatory Visit: Payer: Self-pay

## 2019-12-17 VITALS — Temp 98.2°F | Ht 71.5 in | Wt 250.0 lb

## 2019-12-17 DIAGNOSIS — J019 Acute sinusitis, unspecified: Secondary | ICD-10-CM | POA: Diagnosis not present

## 2019-12-17 MED ORDER — AMOXICILLIN-POT CLAVULANATE 875-125 MG PO TABS: 1.0000 | ORAL_TABLET | Freq: Two times a day (BID) | ORAL | 0 refills | Status: DC

## 2019-12-17 NOTE — Progress Notes (Signed)
Patient ID: Antonio Ward, male   DOB: June 10, 1975, 44 y.o.   MRN: [id number]  This visit type was conducted due to national recommendations for restrictions regarding the COVID-19 pandemic in an effort to limit this patient's exposure and mitigate transmission in our community.   Virtual Visit via Telephone Note  I connected with [name] on 12/17/19 at  9:45 AM EDT by telephone and verified that I am speaking with the correct person using two identifiers.   I discussed the limitations, risks, security and privacy concerns of performing an evaluation and management service by telephone and the availability of in person appointments. I also discussed with the patient that there may be a patient responsible charge related to this service. The patient expressed understanding and agreed to proceed.  Location patient: home Location provider: work or home office Participants present for the call: patient, provider Patient did not have a visit in the prior 7 days to address this/these issue(s).   History of Present Illness: Antonio Ward called with about a 3-week history of sinus congestion and concern for acute sinusitis. He has a cabin he is working on up in [place] and has been doing a lot of renovations which have produced significant dust. He had similar occurrence back in April and sinus symptoms seem to worsen after exposure to dust. He has daily nasal congestion. Takes Sudafed with minimal temporary relief. No headaches. Does have some thick colored nasal discharge. No bloody discharge. No fever. No significant cough. No dyspnea.  Past Medical History:  Diagnosis Date  . GERD (gastroesophageal reflux disease)    Past Surgical History:  Procedure Laterality Date  . right leg severely broken Right 1999   titanium rod, plate and 8 screws  . TONSILLECTOMY  1980  . WISDOM TOOTH EXTRACTION  1998    reports that he quit smoking about 16 years ago. His smoking use included cigarettes. He has a 10.00  pack-year smoking history. He has never used smokeless tobacco. He reports that he does not drink alcohol and does not use drugs. family history includes Alcohol abuse in his mother; Cancer in his paternal grandmother; Cervical cancer in his maternal grandmother; Depression in his mother; Heart disease in his father; Hypertension in his father. No Known Allergies    Observations/Objective: Patient sounds cheerful and well on the phone. I do not appreciate any SOB. Speech and thought processing are grossly intact. Patient reported vitals:  Assessment and Plan:  Persistent sinus congestive symptoms for several weeks now. Differential is infection versus possible allergic component.  -We have suggested consideration for over-the-counter antihistamine and/or Flonase for future nasal congestive symptom -Given duration of current symptoms start Augmentin 875 mg twice daily with food for 10 days. Follow-up for any persistent or worsening symptoms  Follow Up Instructions:  -As above   99441 5-10 99442 11-20 99443 21-30 I did not refer this patient for an OV in the next 24 hours for this/these issue(s).  I discussed the assessment and treatment plan with the patient. The patient was provided an opportunity to ask questions and all were answered. The patient agreed with the plan and demonstrated an understanding of the instructions.   The patient was advised to call back or seek an in-person evaluation if the symptoms worsen or if the condition fails to improve as anticipated.  I provided 13 minutes of non-face-to-face time during this encounter.   Evelena Peat, MD

## 2020-01-11 ENCOUNTER — Ambulatory Visit: Payer: Commercial Managed Care - PPO | Attending: Internal Medicine

## 2020-01-11 DIAGNOSIS — Z23 Encounter for immunization: Secondary | ICD-10-CM

## 2020-01-11 NOTE — Progress Notes (Signed)
   Covid-19 Vaccination Clinic  Name:  JACAI KIPP    MRN: [id number] DOB: 1975-09-27  01/11/2020  Mr. Andres was observed post Covid-19 immunization for 15 minutes without incident. He was provided with Vaccine Information Sheet and instruction to access the V-Safe system.   Mr. Harville was instructed to call 911 with any severe reactions post vaccine: Marland Kitchen Difficulty breathing  . Swelling of face and throat  . A fast heartbeat  . A bad rash all over body  . Dizziness and weakness

## 2020-05-19 ENCOUNTER — Ambulatory Visit (INDEPENDENT_AMBULATORY_CARE_PROVIDER_SITE_OTHER): Payer: 59 | Admitting: Family Medicine

## 2020-05-19 ENCOUNTER — Encounter: Payer: Self-pay | Admitting: Family Medicine

## 2020-05-19 ENCOUNTER — Other Ambulatory Visit: Payer: Self-pay

## 2020-05-19 VITALS — BP 108/76 | HR 89 | Temp 98.5°F | Ht 72.0 in | Wt 260.2 lb

## 2020-05-19 DIAGNOSIS — Z0000 Encounter for general adult medical examination without abnormal findings: Secondary | ICD-10-CM | POA: Diagnosis not present

## 2020-05-19 LAB — LIPID PANEL
Cholesterol: 102 mg/dL (ref 0–200)
HDL: 39.8 mg/dL (ref >39.00)
LDL Cholesterol: 51 mg/dL (ref 0–99)
NonHDL: 62.39
Total CHOL/HDL Ratio: 3
Triglycerides: 57 mg/dL (ref 0.0–149.0)
VLDL: 11.4 mg/dL (ref 0.0–40.0)

## 2020-05-19 LAB — CBC WITH DIFFERENTIAL/PLATELET
Basophils Absolute: 0 10*3/uL (ref 0.0–0.1)
Basophils Relative: 0.7 % (ref 0.0–3.0)
Eosinophils Absolute: 0.3 10*3/uL (ref 0.0–0.7)
Eosinophils Relative: 5.7 % — ABNORMAL HIGH (ref 0.0–5.0)
HCT: 45.7 % (ref 39.0–52.0)
Hemoglobin: 15.6 g/dL (ref 13.0–17.0)
Lymphocytes Relative: 21.4 % (ref 12.0–46.0)
Lymphs Abs: 1.1 10*3/uL (ref 0.7–4.0)
MCHC: 34.1 g/dL (ref 30.0–36.0)
MCV: 83.4 fl (ref 78.0–100.0)
Monocytes Absolute: 0.6 10*3/uL (ref 0.1–1.0)
Monocytes Relative: 11.2 % (ref 3.0–12.0)
Neutro Abs: 3.2 10*3/uL (ref 1.4–7.7)
Neutrophils Relative %: 61 % (ref 43.0–77.0)
Platelets: 257 10*3/uL (ref 150.0–400.0)
RBC: 5.48 Mil/uL (ref 4.22–5.81)
RDW: 14.3 % (ref 11.5–15.5)
WBC: 5.2 10*3/uL (ref 4.0–10.5)

## 2020-05-19 LAB — BASIC METABOLIC PANEL
BUN: 13 mg/dL (ref 6–23)
CO2: 25 mEq/L (ref 19–32)
Calcium: 8.9 mg/dL (ref 8.4–10.5)
Chloride: 104 mEq/L (ref 96–112)
Creatinine, Ser: 1 mg/dL (ref 0.40–1.50)
GFR: 90.28 mL/min (ref >60.00)
Glucose, Bld: 89 mg/dL (ref 70–99)
Potassium: 4.1 mEq/L (ref 3.5–5.1)
Sodium: 137 mEq/L (ref 135–145)

## 2020-05-19 LAB — HEPATIC FUNCTION PANEL
ALT: 23 U/L (ref 0–53)
AST: 19 U/L (ref 0–37)
Albumin: 4.2 g/dL (ref 3.5–5.2)
Alkaline Phosphatase: 52 U/L (ref 39–117)
Bilirubin, Direct: 0.2 mg/dL (ref 0.0–0.3)
Total Bilirubin: 0.6 mg/dL (ref 0.2–1.2)
Total Protein: 7.3 g/dL (ref 6.0–8.3)

## 2020-05-19 LAB — TSH: TSH: 3.98 u[IU]/mL (ref 0.35–4.50)

## 2020-05-19 NOTE — Patient Instructions (Signed)
Coronary Calcium Scan A coronary calcium scan is an imaging test used to look for deposits of plaque in the inner lining of the blood vessels of the heart (coronary arteries). Plaque is made up of calcium, protein, and fatty substances. These deposits of plaque can partly clog and narrow the coronary arteries without producing any symptoms or warning signs. This puts a person at risk for a heart attack. This test is recommended for people who are at moderate risk for heart disease. The test can find plaque deposits before symptoms develop. Tell a health care provider about:  Any allergies you have.  All medicines you are taking, including vitamins, herbs, eye drops, creams, and over-the-counter medicines.  Any problems you or family members have had with anesthetic medicines.  Any blood disorders you have.  Any surgeries you have had.  Any medical conditions you have.  Whether you are pregnant or may be pregnant. What are the risks? Generally, this is a safe procedure. However, problems may occur, including:  Harm to a pregnant woman and her unborn baby. This test involves the use of radiation. Radiation exposure can be dangerous to a pregnant woman and her unborn baby. If you are pregnant or think you may be pregnant, you should not have this procedure done.  Slight increase in the risk of cancer. This is because of the radiation involved in the test. What happens before the procedure? Ask your health care provider for any specific instructions on how to prepare for this procedure. You may be asked to avoid products that contain caffeine, tobacco, or nicotine for 4 hours before the procedure. What happens during the procedure?  You will undress and remove any jewelry from your neck or chest.  You will put on a hospital gown.  Sticky electrodes will be placed on your chest. The electrodes will be connected to an electrocardiogram (ECG) machine to record a tracing of the electrical  activity of your heart.  You will lie down on a curved bed that is attached to the CT scanner.  You may be given medicine to slow down your heart rate so that clear pictures can be created.  You will be moved into the CT scanner, and the CT scanner will take pictures of your heart. During this time, you will be asked to lie still and hold your breath for 2-3 seconds at a time while each picture of your heart is being taken. The procedure may vary among health care providers and hospitals.   What happens after the procedure?  You can get dressed.  You can return to your normal activities.  It is up to you to get the results of your procedure. Ask your health care provider, or the department that is doing the procedure, when your results will be ready. Summary  A coronary calcium scan is an imaging test used to look for deposits of plaque in the inner lining of the blood vessels of the heart (coronary arteries). Plaque is made up of calcium, protein, and fatty substances.  Generally, this is a safe procedure. Tell your health care provider if you are pregnant or may be pregnant.  Ask your health care provider for any specific instructions on how to prepare for this procedure.  A CT scanner will take pictures of your heart.  You can return to your normal activities after the scan is done. This information is not intended to replace advice given to you by your health care provider. Make sure you discuss   any questions you have with your health care provider. Document Revised: 10/02/2018 Document Reviewed: 10/02/2018 Elsevier Patient Education  2021 Elsevier Inc.  

## 2020-05-19 NOTE — Progress Notes (Signed)
Established Patient Office Visit  Subjective:  Patient ID: Antonio Ward, male    DOB: Nov 16, 1975  Age: 45 y.o. MRN: [id number]  CC:  Chief Complaint  Patient presents with  . Annual Exam    HPI Antonio Ward presents for physical exam.  He quit smoking back in 2004.  Takes no regular medications.  He had some GERD issues in the past.  Takes over-the-counter Prilosec as needed.  Health maintenance reviewed  -declines flu vaccine -COVID vaccines up-to-date -No history of hepatitis C screening.  Relatively low risk. -Tetanus is due but he declines at this time -No history of screening colonoscopy.  No family history of colon cancer.  Social history-he is married and has 2 children.  Quit smoking as above 2004.  No regular alcohol use.  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  Father had history of congestive heart failure and CAD as well as hypertension.  He has a brother who does not stay in touch with very often.  Past Medical History:  Diagnosis Date  . GERD (gastroesophageal reflux disease)     Past Surgical History:  Procedure Laterality Date  . right leg severely broken Right 1999   titanium rod, plate and 8 screws  . TONSILLECTOMY  1980  . WISDOM TOOTH EXTRACTION  1998    Family History  Problem Relation Age of Onset  . Alcohol abuse Mother   . Depression Mother   . Heart disease Father   . Hypertension Father   . Cervical cancer Maternal Grandmother   . Cancer Paternal Grandmother        cervical ??  . Colon cancer Neg Hx   . Esophageal cancer Neg Hx     Social History   Socioeconomic History  . Marital status: Married    Spouse name: Not on file  . Number of children: Not on file  . Years of education: Not on file  . Highest education level: Not on file  Occupational History  . Not on file  Tobacco Use  . Smoking status: Former Smoker    Packs/day: 0.50    Years:  20.00    Pack years: 10.00    Types: Cigarettes    Quit date: 01/26/2003    Years since quitting: 17.3  . Smokeless tobacco: Never Used  Vaping Use  . Vaping Use: Never used  Substance and Sexual Activity  . Alcohol use: No  . Drug use: No  . Sexual activity: Yes  Other Topics Concern  . Not on file  Social History Narrative  . Not on file   Social Determinants of Health   Financial Resource Strain: Not on file  Food Insecurity: Not on file  Transportation Needs: Not on file  Physical Activity: Not on file  Stress: Not on file  Social Connections: Not on file  Intimate Partner Violence: Not on file    Outpatient Medications Prior to Visit  Medication Sig Dispense Refill  . omeprazole (PRILOSEC OTC) 20 MG tablet Take 1 tablet (20 mg total) by mouth daily. (Patient taking differently: Take 40 mg by mouth daily.) 30 tablet 3  . amoxicillin-clavulanate (AUGMENTIN) 875-125 MG tablet Take 1 tablet by mouth 2 (two) times daily. 20 tablet 0   No facility-administered medications prior to visit.    Allergies  Allergen Reactions  . Penicillins     Per patient unknown action noted in childhood  ROS Review of Systems  Constitutional: Negative for activity change, appetite change, fatigue and fever.  HENT: Negative for congestion, ear pain and trouble swallowing.   Eyes: Negative for pain and visual disturbance.  Respiratory: Negative for cough, shortness of breath and wheezing.   Cardiovascular: Negative for chest pain and palpitations.  Gastrointestinal: Negative for abdominal distention, abdominal pain, blood in stool, constipation, diarrhea, nausea, rectal pain and vomiting.  Genitourinary: Negative for dysuria, hematuria and testicular pain.  Musculoskeletal: Negative for arthralgias and joint swelling.  Skin: Negative for rash.  Neurological: Negative for dizziness, syncope and headaches.  Hematological: Negative for adenopathy.  Psychiatric/Behavioral: Negative for  confusion and dysphoric mood.      Objective:    Physical Exam Constitutional:      General: He is not in acute distress.    Appearance: He is well-developed and well-nourished.  HENT:     Head: Normocephalic and atraumatic.     Right Ear: External ear normal.     Left Ear: External ear normal.     Mouth/Throat:     Mouth: Oropharynx is clear and moist.  Eyes:     Extraocular Movements: EOM normal.     Conjunctiva/sclera: Conjunctivae normal.     Pupils: Pupils are equal, round, and reactive to light.  Neck:     Thyroid: No thyromegaly.  Cardiovascular:     Rate and Rhythm: Normal rate and regular rhythm.     Heart sounds: Normal heart sounds. No murmur heard.   Pulmonary:     Effort: No respiratory distress.     Breath sounds: No wheezing or rales.  Abdominal:     General: Bowel sounds are normal. There is no distension.     Palpations: Abdomen is soft. There is no mass.     Tenderness: There is no abdominal tenderness. There is no guarding or rebound.  Musculoskeletal:        General: No edema.     Cervical back: Normal range of motion and neck supple.     Right lower leg: No edema.     Left lower leg: No edema.  Lymphadenopathy:     Cervical: No cervical adenopathy.  Skin:    Findings: No rash.  Neurological:     Mental Status: He is alert and oriented to person, place, and time.     Cranial Nerves: No cranial nerve deficit.  Psychiatric:        Mood and Affect: Mood and affect normal.     BP 108/76 (BP Location: Left Arm, Patient Position: Sitting, Cuff Size: Large)   Pulse 89   Temp 98.5 F (36.9 C) (Oral)   Ht 6' (1.829 m)   Wt 260 lb 3.2 oz (118 kg)   SpO2 96%   BMI 35.29 kg/m  Wt Readings from Last 3 Encounters:  05/19/20 260 lb 3.2 oz (118 kg)  12/17/19 250 lb (113.4 kg)  06/07/19 265 lb (120.2 kg)     Health Maintenance Due  Topic Date Due  . COLONOSCOPY (Pts 45-49yrs Insurance coverage will need to be confirmed)  Never done    There  are no preventive care reminders to display for this patient.  Lab Results  Component Value Date   TSH 2.41 06/07/2019   Lab Results  Component Value Date   WBC 4.6 06/07/2019   HGB 15.7 06/07/2019   HCT 46.8 06/07/2019   MCV 85.5 06/07/2019   PLT 279.0 06/07/2019   Lab Results  Component Value Date  NA 138 06/07/2019   K 4.5 06/07/2019   CO2 30 06/07/2019   GLUCOSE 103 (H) 06/07/2019   BUN 16 06/07/2019   CREATININE 1.11 06/07/2019   BILITOT 0.6 06/07/2019   ALKPHOS 59 06/07/2019   AST 31 06/07/2019   ALT 42 06/07/2019   PROT 7.6 06/07/2019   ALBUMIN 4.4 06/07/2019   CALCIUM 9.5 06/07/2019   GFR 71.47 06/07/2019   Lab Results  Component Value Date   CHOL 147 06/07/2019   Lab Results  Component Value Date   HDL 45.00 06/07/2019   Lab Results  Component Value Date   LDLCALC 87 06/07/2019   Lab Results  Component Value Date   TRIG 74.0 06/07/2019   Lab Results  Component Value Date   CHOLHDL 3 06/07/2019   No results found for: HGBA1C    Assessment & Plan:   Problem List Items Addressed This Visit   None   Visit Diagnoses    Physical exam    -  Primary   Relevant Orders   Basic metabolic panel   Lipid panel   CBC with Differential/Platelet   TSH   Hepatic function panel   Hep C Antibody    He is overweight for height.  Does have family history of CAD in his father.  He would like to further stratify his risk for CAD  -Obtain labs above -We discussed colon cancer screening at age 3 and he will check on insurance coverage and let us know if interested. -Offered flu vaccine but declines.  Also offered tetanus vaccine but declines. -Discussed coronary calcium score screening and he would like to proceed.  He realizes this would not be covered by insurance  No orders of the defined types were placed in this encounter.   Follow-up: No follow-ups on file.    Evelena Peat, MD

## 2020-05-20 ENCOUNTER — Telehealth: Payer: Self-pay | Admitting: Family Medicine

## 2020-05-20 LAB — HEPATITIS C ANTIBODY
Hepatitis C Ab: NONREACTIVE
SIGNAL TO CUT-OFF: 0.02 (ref <1.00)

## 2020-05-20 NOTE — Telephone Encounter (Signed)
Spoke with patient and reassured no concerns with labs.

## 2020-05-20 NOTE — Progress Notes (Signed)
Mychart message sent; Labs all look great.  No concerns.  Hep C antibody is pending.

## 2020-05-20 NOTE — Telephone Encounter (Signed)
Pt is calling in with a concern with one of his lab results being a little high.  Pt would like to have Dr. Caryl Never call back b/c he saw where Dr. Caryl Never stated that there is "No Concerns".

## 2020-06-01 ENCOUNTER — Telehealth: Payer: Self-pay | Admitting: Family Medicine

## 2020-06-01 DIAGNOSIS — R519 Headache, unspecified: Secondary | ICD-10-CM

## 2020-06-01 NOTE — Telephone Encounter (Signed)
Pt is calling with a question requesting providers voice mail and did not want to elaborate or it and stated he only wanted to speak with his provider.

## 2020-06-03 NOTE — Addendum Note (Signed)
Addended by: Kristian Covey on: 06/03/2020 07:43 AM   Modules accepted: Orders

## 2020-06-03 NOTE — Telephone Encounter (Signed)
Patient relates some recent atypical head symptoms mostly right occipital and parietal region.  Seems to be occurring more frequently and possibly in severity.  No head trauma.  No focal neurologic findings on recent exam.  No history of migraines.  Given atypical symptoms we will go ahead and get CT head without contrast.

## 2020-06-08 ENCOUNTER — Encounter: Payer: Commercial Managed Care - PPO | Admitting: Family Medicine

## 2020-06-09 ENCOUNTER — Ambulatory Visit (INDEPENDENT_AMBULATORY_CARE_PROVIDER_SITE_OTHER)
Admission: RE | Admit: 2020-06-09 | Discharge: 2020-06-09 | Disposition: A | Discharge status: Home / Self Care | Payer: 59 | Source: Ambulatory Visit | Attending: Family Medicine | Admitting: Family Medicine

## 2020-06-09 ENCOUNTER — Ambulatory Visit (INDEPENDENT_AMBULATORY_CARE_PROVIDER_SITE_OTHER)
Admission: RE | Admit: 2020-06-09 | Discharge: 2020-06-09 | Disposition: A | Discharge status: Home / Self Care | Payer: Self-pay | Source: Ambulatory Visit | Attending: Family Medicine | Admitting: Family Medicine

## 2020-06-09 ENCOUNTER — Other Ambulatory Visit: Payer: Self-pay

## 2020-06-09 ENCOUNTER — Encounter: Payer: Self-pay | Admitting: Family Medicine

## 2020-06-09 DIAGNOSIS — R519 Headache, unspecified: Secondary | ICD-10-CM | POA: Diagnosis not present

## 2020-06-09 DIAGNOSIS — Z0000 Encounter for general adult medical examination without abnormal findings: Secondary | ICD-10-CM

## 2020-06-09 NOTE — Telephone Encounter (Signed)
Called pt.  I went over CT report- but we didn't see the actual calcium score yet.  Will check tomorrow to see if back.

## 2020-06-17 ENCOUNTER — Encounter: Payer: Self-pay | Admitting: Family Medicine

## 2020-06-17 DIAGNOSIS — R519 Headache, unspecified: Secondary | ICD-10-CM

## 2020-06-18 NOTE — Telephone Encounter (Signed)
I have placed neurology referral.  He should receive a call from neurology.

## 2020-09-04 ENCOUNTER — Ambulatory Visit: Payer: 59 | Admitting: Neurology

## 2020-09-04 ENCOUNTER — Encounter: Payer: Self-pay | Admitting: Neurology

## 2020-09-04 DIAGNOSIS — G4489 Other headache syndrome: Secondary | ICD-10-CM

## 2020-09-04 NOTE — Progress Notes (Signed)
Reason for visit: Head discomfort  Referring physician: Dr. Marylouise Stacks is a 45 y.o. male  History of present illness:  Antonio Ward is a 45 year old right-handed white male with a history of onset of an unusual sensation on the head just above and behind the right ear.  The patient does not describe the sensation as a headache, but he has a dull sensation that is slightly uncomfortable.  If he rubs the area he may hear a pop in the ear and the discomfort seems to improve.  The discomfort is not present at all times, but it possibly may come and go at some point during the day.  He has not had any ear pain or change in hearing.  He denies any significant neck pain but he does have some slight tightness of the neck.  He denies any numbness or tingling sensations on the head, face, arms, or legs.  He denies any vision changes or dizziness or vertigo.  He denies any balance changes or difficulty controlling the bowels or the bladder.  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  He gives a history of altitude sickness.  The patient has undergone a CT scan of the brain, this was completely within normal limits, he comes to this office for further evaluation.  Past Medical History:  Diagnosis Date   GERD (gastroesophageal reflux disease)     Past Surgical History:  Procedure Laterality Date   right leg severely broken Right 1999   titanium rod, plate and 8 screws   TONSILLECTOMY  1980   WISDOM TOOTH EXTRACTION  1998    Family History  Problem Relation Age of Onset   Alcohol abuse Mother    Depression Mother    Heart disease Father    Hypertension Father    Cervical cancer Maternal Grandmother    Cancer Paternal Grandmother        cervical ??   Colon cancer Neg Hx    Esophageal cancer Neg Hx     Social history:  reports that he quit smoking about 17 years ago. His smoking use included cigarettes. He has a 10.00 pack-year smoking history. He has never  used smokeless tobacco. He reports that he does not drink alcohol and does not use drugs.  Medications:  Prior to Admission medications   Medication Sig Start Date End Date Taking? Authorizing Provider  omeprazole (PRILOSEC OTC) 20 MG tablet Take 1 tablet (20 mg total) by mouth daily. Patient taking differently: Take 40 mg by mouth daily. 01/11/18   Unk Lightning, PA      Allergies  Allergen Reactions   Penicillins     Per patient unknown action noted in childhood    ROS:  Out of a complete 14 system review of symptoms, the patient complains only of the following symptoms, and all other reviewed systems are negative.  Head sensation, right  Blood pressure 122/85, pulse 78, height 6' (1.829 m), weight 263 lb (119.3 kg).  Physical Exam  General: The patient is alert and cooperative at the time of the examination.  Eyes: Pupils are equal, round, and reactive to light. Discs are flat bilaterally.  Ears: Tympanic membranes are clear bilaterally.  Neck: The neck is supple, no carotid bruits are noted.  Respiratory: The respiratory examination is clear.  Cardiovascular: The cardiovascular examination reveals a regular rate and rhythm, no obvious murmurs or rubs are noted.  Neuromuscular: Range of movement cervical spine is  full.  No crepitus is noted within the temporomandibular joints on either side.  Skin: Extremities are without significant edema.  Neurologic Exam  Mental status: The patient is alert and oriented x 3 at the time of the examination. The patient has apparent normal recent and remote memory, with an apparently normal attention span and concentration ability.  Cranial nerves: Facial symmetry is present. There is good sensation of the face to pinprick and soft touch bilaterally. The strength of the facial muscles and the muscles to head turning and shoulder shrug are normal bilaterally. Speech is well enunciated, no aphasia or dysarthria is noted.  Extraocular movements are full. Visual fields are full. The tongue is midline, and the patient has symmetric elevation of the soft palate. No obvious hearing deficits are noted.  Motor: The motor testing reveals 5 over 5 strength of all 4 extremities. Good symmetric motor tone is noted throughout.  Sensory: Sensory testing is intact to pinprick, soft touch, vibration sensation, and position sense on all 4 extremities. No evidence of extinction is noted.  Coordination: Cerebellar testing reveals good finger-nose-finger and heel-to-shin bilaterally.  Gait and station: Gait is normal. Tandem gait is normal. Romberg is negative. No drift is seen.  Reflexes: Deep tendon reflexes are symmetric and normal bilaterally. Toes are downgoing bilaterally.   CT head 06/09/20:  IMPRESSION: Normal study.  * CT scan images were reviewed online. I agree with the written report.    Assessment/Plan:  1.  Head pressure sensation, right  The patient does not describe true headaches.  There is an unusual sensation just above and behind the right ear that is well localized, this will come and go throughout the day and is not present at all times.  CT scan of the brain was completely normal.  I suspect that this may represent a mild discomfort from a muscle insertion issue, possibly related to some mild arthritis in the neck.  The patient does report some popping in the right ear when rubbing behind the ear or rubbing above the ear, but I see no pathology involving the ear on the CT or on clinical examination.  The patient believes that the symptoms have gradually improved over time.  We will self monitor at this point, the patient will contact me if he believes that there are changes in the future.  If this is the case, I will consider MRI of the brain with and without gadolinium enhancement.  I suspect that the above issue benign in nature.  There is no evidence of temporomandibular joint dysfunction on clinical  examination.  Antonio Palau MD 09/04/2020 9:39 AM  [facility] [address] [place], [place] [address]  Phone [phone] Fax [phone]

## 2020-12-11 ENCOUNTER — Ambulatory Visit: Payer: 59

## 2021-04-08 ENCOUNTER — Encounter: Payer: Self-pay | Admitting: Psychiatry

## 2021-04-08 ENCOUNTER — Other Ambulatory Visit: Payer: Self-pay

## 2021-04-08 ENCOUNTER — Ambulatory Visit: Payer: No Typology Code available for payment source | Admitting: Psychiatry

## 2021-04-08 VITALS — BP 115/75 | HR 76 | Ht 72.0 in | Wt 273.2 lb

## 2021-04-08 DIAGNOSIS — M5481 Occipital neuralgia: Secondary | ICD-10-CM | POA: Diagnosis not present

## 2021-04-08 MED ORDER — BACLOFEN 10 MG PO TABS: ORAL_TABLET | ORAL | 3 refills | Status: DC

## 2021-04-08 NOTE — Progress Notes (Signed)
° °  CC:  headaches  Follow-up Visit  Last visit: 09/04/20  Brief HPI: 46 year old male who follows in clinic for ear pain and popping which began in early 2022. [facility] 06/09/20 was unremarkable.  At his last visit his symptoms were improving. The plan was to monitor over time and consider MRI if symptoms worsen.  Interval History: He continues to have a daily dull ache and numbing sensation above his right ear. Sensation is constant but fluctuates. He can occasionally trigger sharp pain by turning his head. He does get some relief if he pops his jaw. Symptoms are worse after a long day.  Had X-rays from dentist which did not show signs of TMJ.  Headache days per month: 30 Headache free days per month: 0  Current Headache Regimen: Preventative: none Abortive: tylenol, ibuprofen   Prior Therapies                                  Tylenol Ibuprofen Flexeril - sedating  Physical Exam:   Vital Signs: BP 115/75    Pulse 76    Ht 6' (1.829 m)    Wt 273 lb 3.2 oz (123.9 kg)    BMI 37.05 kg/m  GENERAL:  well appearing, in no acute distress, alert  SKIN:  Color, texture, turgor normal. No rashes or lesions HEAD:  Normocephalic/atraumatic. RESP: normal respiratory effort MSK:  +mild tenderness to palpation over right ear. No TMJ click.  NEUROLOGICAL: Mental Status: Alert, oriented to person, place and time, Follows commands, and Speech fluent and appropriate. Cranial Nerves: PERRL, face symmetric, no dysarthria, hearing grossly intact Motor: moves all extremities equally Gait: normal-based.  IMPRESSION: 46 year old male with a history of GERD who presents for evaluation of right sided headaches. 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 Discussed treatment options including nerve block, preventive medications, and neck PT. He would like to research these options before making a decision. In the meantime will start  baclofen as needed which may be less sedating than Flexeril.  PLAN: -Start baclofen 5-10 mg PRN for headaches, muscle tension -Next steps: consider preventive medication, occipital nerve block, neck PT  Follow-up: 3 months  I spent a total of 30 minutes on the date of the service. Headache education was done. Discussed treatment options including preventive and acute medications, natural supplements, and physical therapy. Discussed medication side effects, adverse reactions and drug interactions. Written educational materials and patient instructions outlining all of the above were given.  Ocie Doyne, MD 04/08/21 9:31 AM

## 2021-04-08 NOTE — Patient Instructions (Addendum)
Occipital neuralgia Your headaches are consistent with occipital neuralgia.This is caused by irritation of the occipital nerve, which travels between your neck muscles and provides sensation to your scalp. Pain is often described as a sharp, shooting pain at the back of the head which travels to the scalp and behind the eye. Other symptoms include tenderness at the base of the head, neck pain, and a sensation of numbness, tingling, or crawling on the scalp. It is often associated with neck tension. When neck muscles are tight they can compress the nerve and cause irritation.   Treatment options include physical therapy for neck pain and tightness. We can also use medications for neuropathic pain (pain originating from a nerve) as well as muscle relaxers. We can also perform an occipital nerve block, which is an injection of steroids and a numbing medication in the back of the head. This can be performed every 3 months.  Options to help prevent the pain: -physical therapy for the neck -medications including gabapentin, cymbalta, or nortriptyline -occipital nerve block  Start baclofen 1/2 tab to 1 tab up to 3 times a day as needed for tension/head pain

## 2021-07-08 ENCOUNTER — Ambulatory Visit: Payer: No Typology Code available for payment source | Admitting: Psychiatry

## 2021-08-23 IMAGING — CT CT HEAD W/O CM
3 of 4 series · 15 of 47 positions shown, 18 images · non-contrast
Comparison: None.

CLINICAL DATA: Chronic headache.

EXAM:
CT HEAD WITHOUT CONTRAST
TECHNIQUE: Contiguous axial images were obtained from the base of the skull
through the vertex without intravenous contrast.

[Series 3: head 2.0 hr68 · axial · 0.43mm/px · z∈[+1623,+1741]mm · 9 of 75 slices shown, 12 images]
[im 8/75  brain]
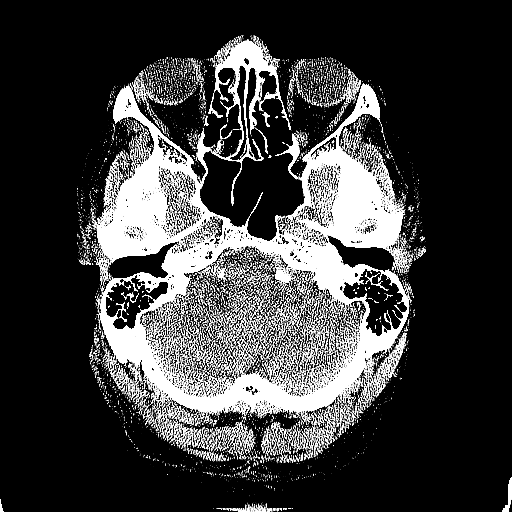
[im 8/75  bone]
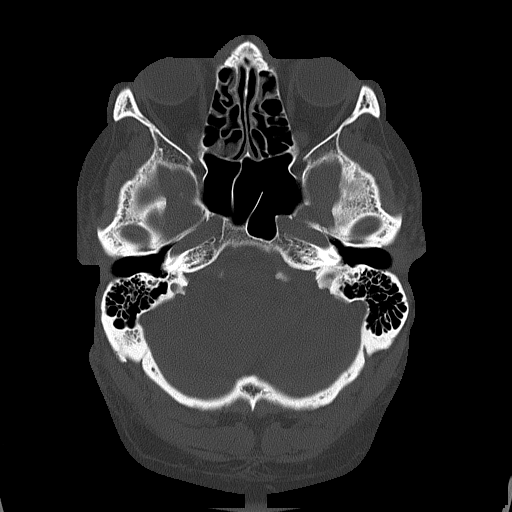
[im 15/75  brain]
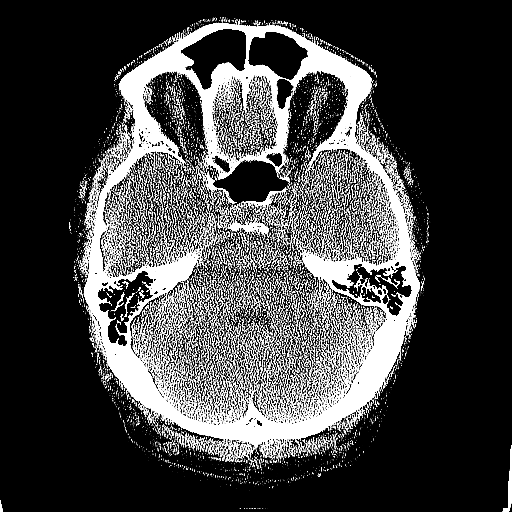
[im 23/75  brain]
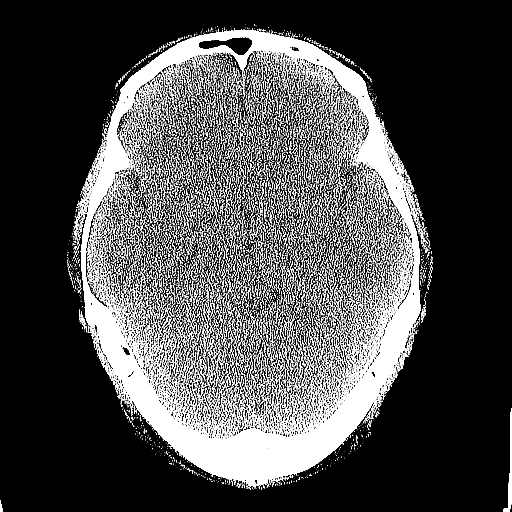
[im 30/75  brain]
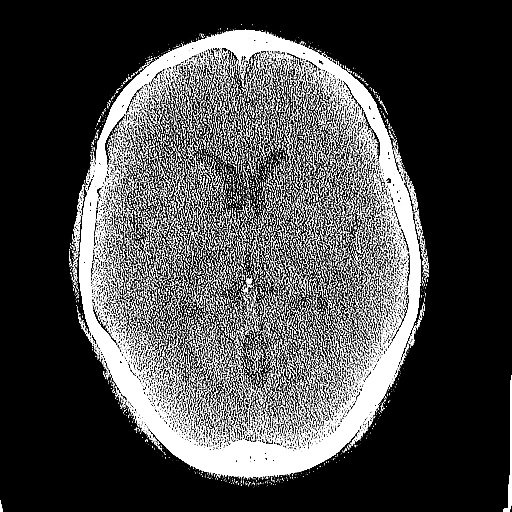
[im 38/75  brain]
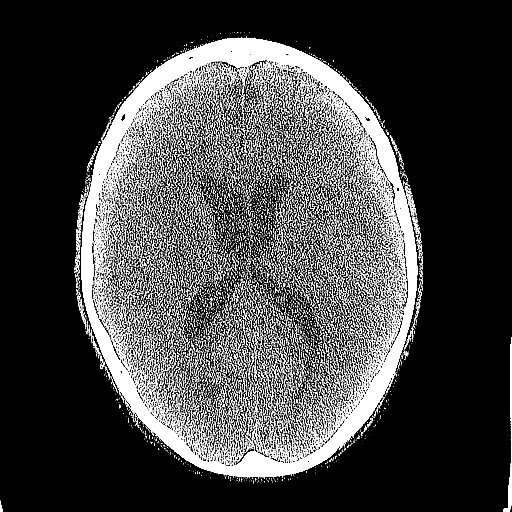
[im 38/75  bone]
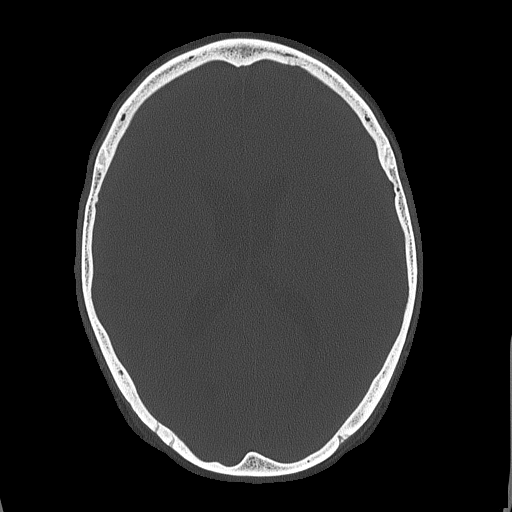
[im 45/75  brain]
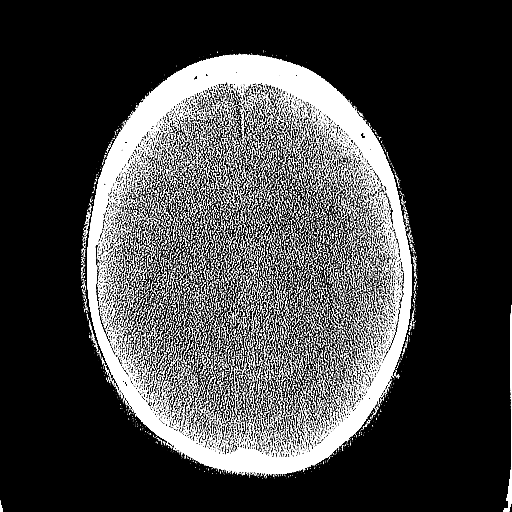
[im 52/75  brain]
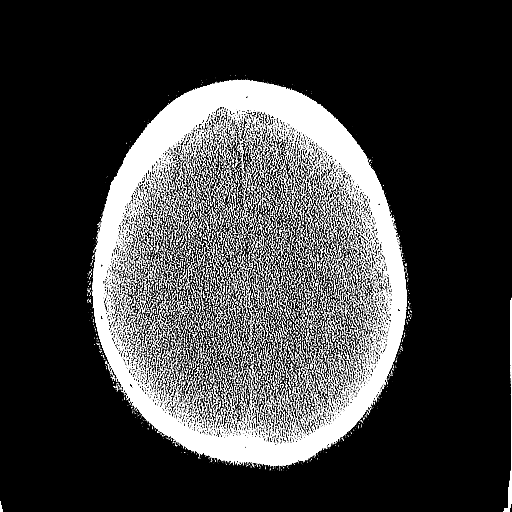
[im 60/75  brain]
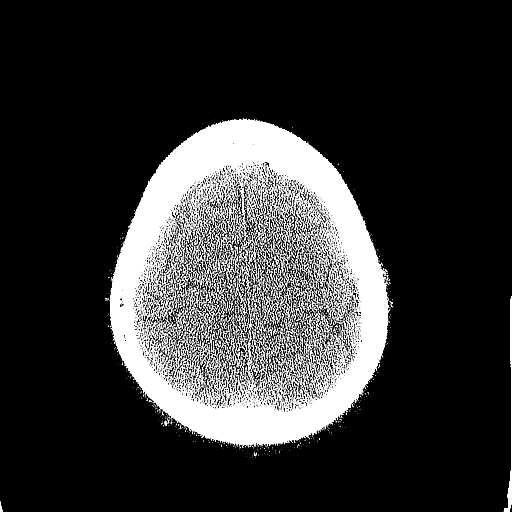
[im 67/75  brain]
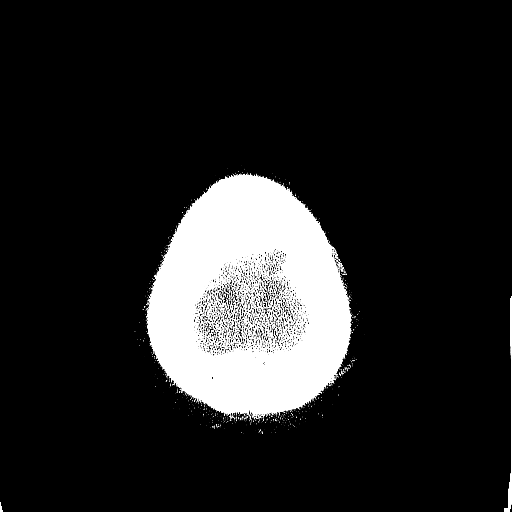
[im 67/75  bone]
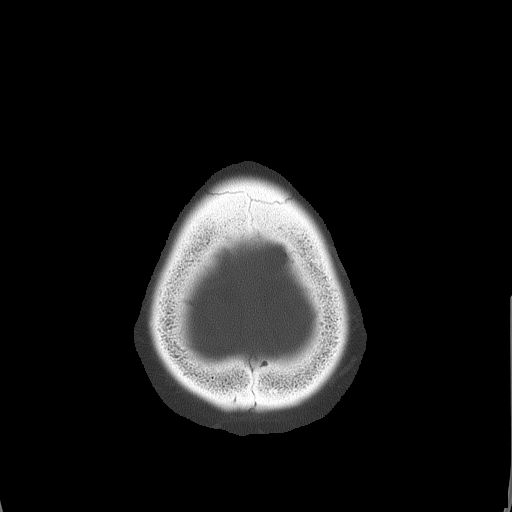

[Series 4: head 3.0 mpr cor · coronal · 0.29mm/px · 3 of 69 slices shown]
[im 23/69  brain]
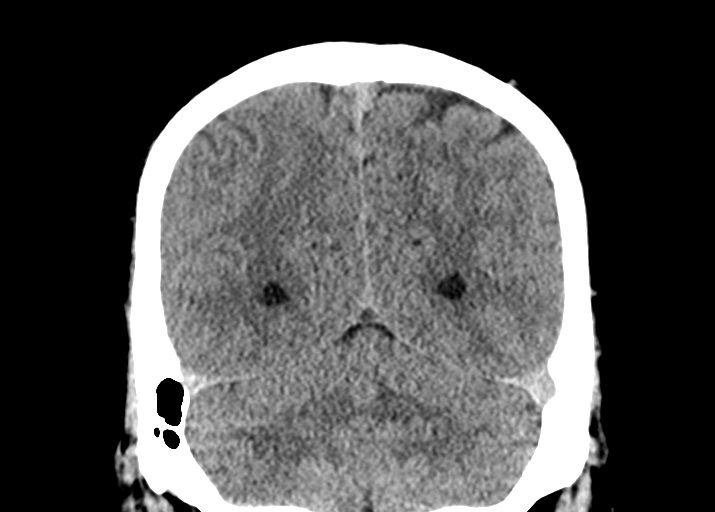
[im 31/69  brain]
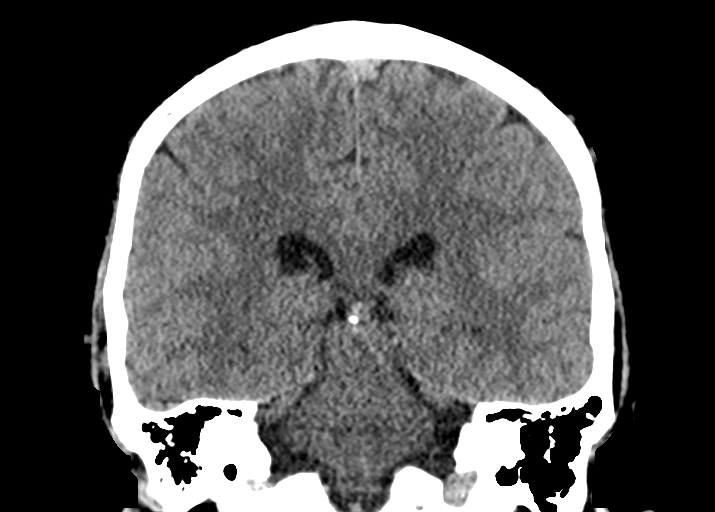
[im 38/69  brain]
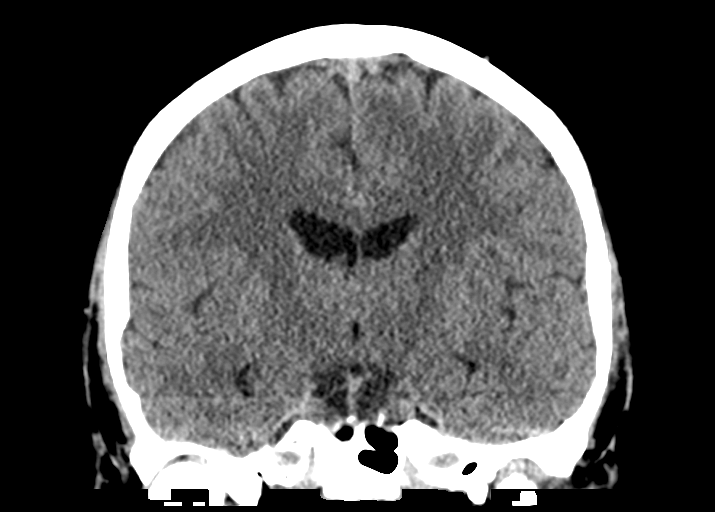

[Series 5: head 3.0 mpr sag · sagittal · 0.31mm/px · 3 of 62 slices shown]
[im 21/62  brain]
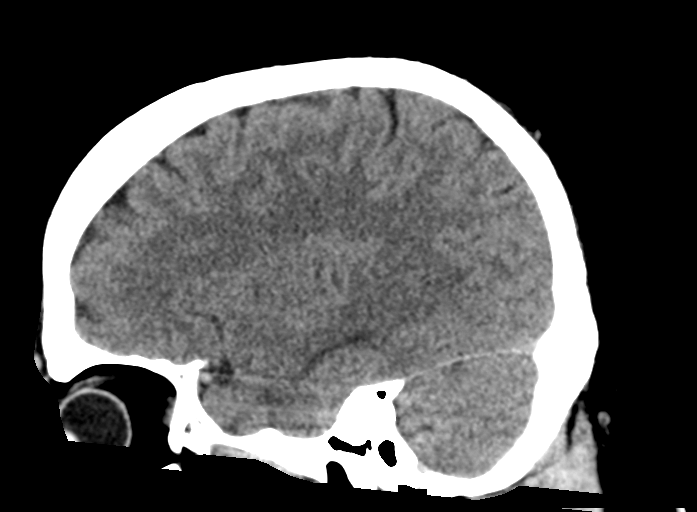
[im 31/62  brain]
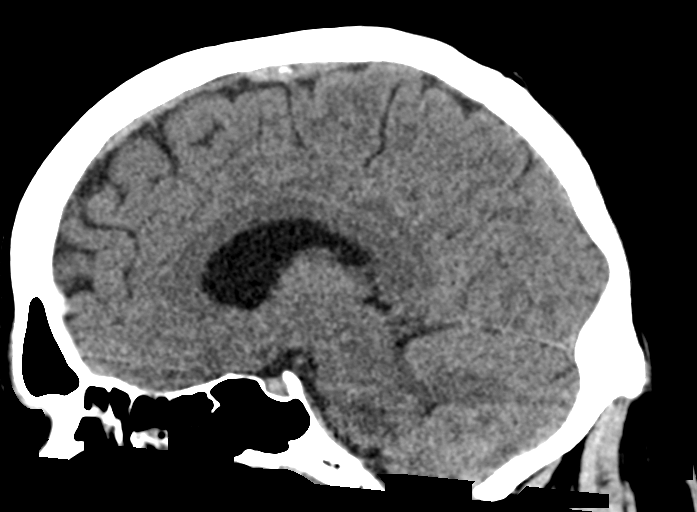
[im 41/62  brain]
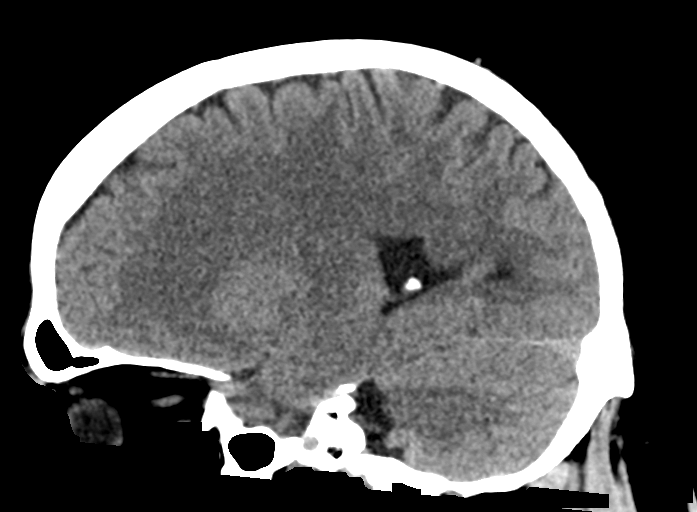

[15 of 47 positions shown; findings below may reference images not displayed]

FINDINGS: Brain: No acute intracranial abnormality. Specifically, no
hemorrhage, hydrocephalus, mass lesion, acute infarction, or
significant intracranial injury.

Vascular: No hyperdense vessel or unexpected calcification.

Skull: No acute calvarial abnormality.

Sinuses/Orbits: Visualized paranasal sinuses and mastoids clear.
Orbital soft tissues unremarkable.

Other: None
IMPRESSION: Normal study.

## 2021-08-23 IMAGING — CT CT CARDIAC CORONARY ARTERY CALCIUM SCORE
3 series · 14 of 20 positions shown, 15 images · non-contrast
Comparison: None.
COMPARISON: None.

Addendum:
EXAM:
OVER-READ INTERPRETATION  CT CHEST

The following report is an over-read performed by radiologist Dr.
Vitaly Ealey [REDACTED] on 06/09/2020. This
over-read does not include interpretation of cardiac or coronary
anatomy or pathology. The coronary calcium score interpretation by
the cardiologist is attached.
CLINICAL DATA: Risk stratification 44 year old male
Coronary Calcium Score
TECHNIQUE: The patient was scanned on a Siemens Force scanner. Axial
non-contrast 3 mm slices were carried out through the heart. The
data set was analyzed on a dedicated work station and scored using
the Agatson method.

[Series 2: casc 3.0 bv41 2 bestsyst 37 % · axial · 0.51mm/px · z∈[-370,-280]mm · 4 of 50 slices shown, 5 images]
[im 10/50  vessel]
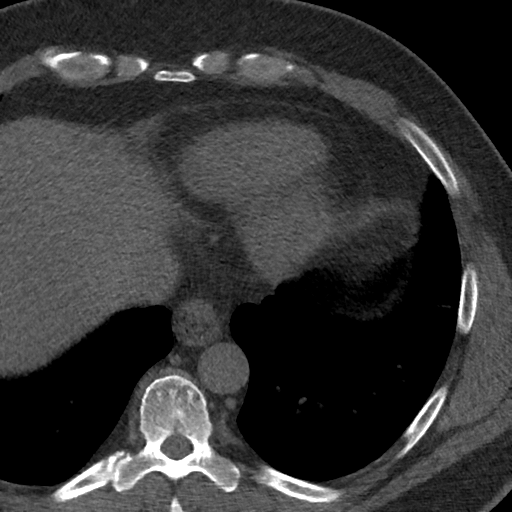
[im 10/50  lung]
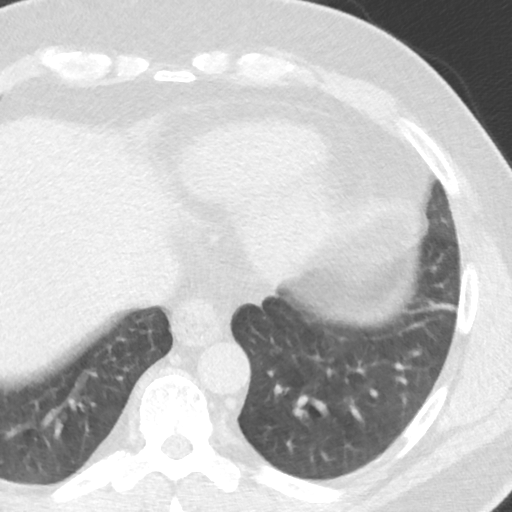
[im 20/50  vessel]
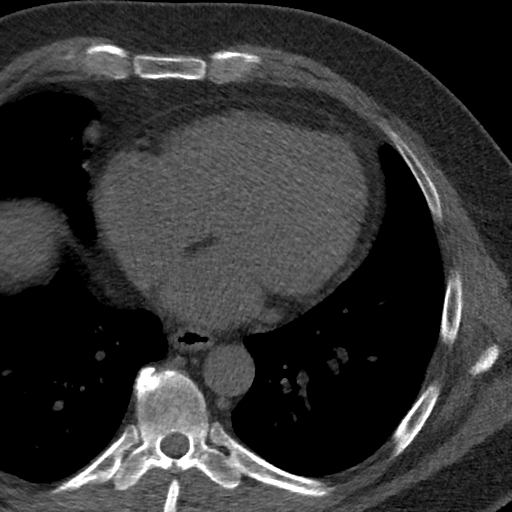
[im 30/50  vessel]
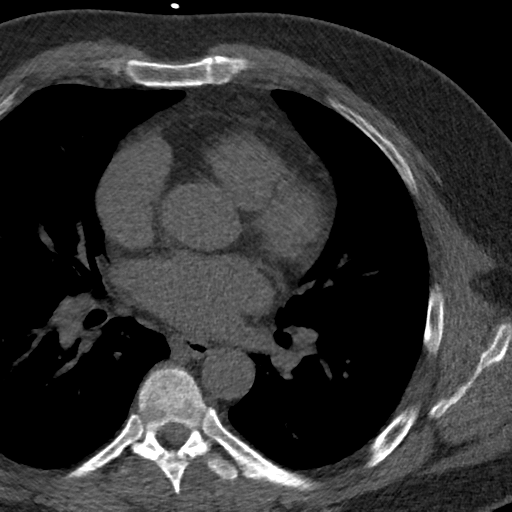
[im 40/50  vessel]
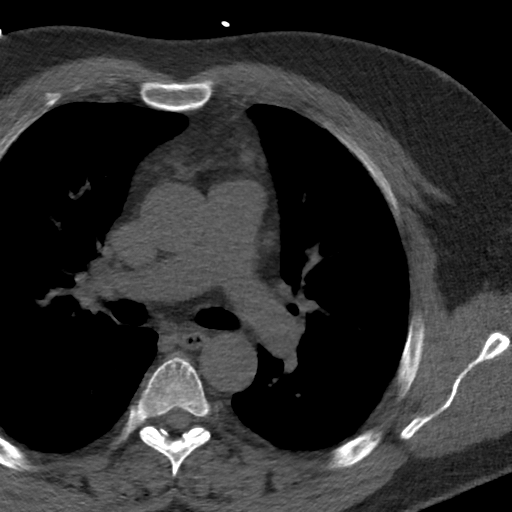

[Series 3: lung 37 % · axial · 0.77mm/px · z∈[-373,-277]mm · 5 of 50 slices shown]
[im 9/50  lung]
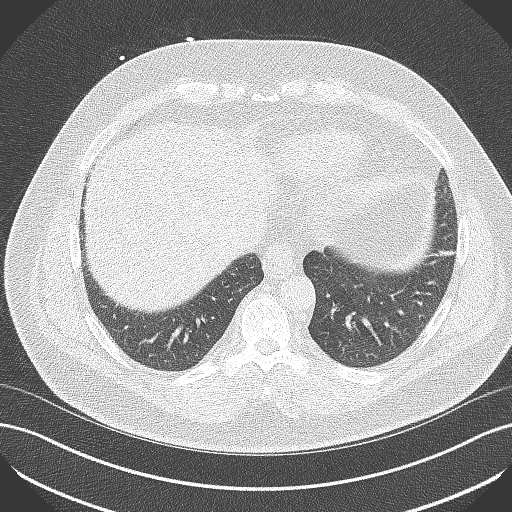
[im 17/50  lung]
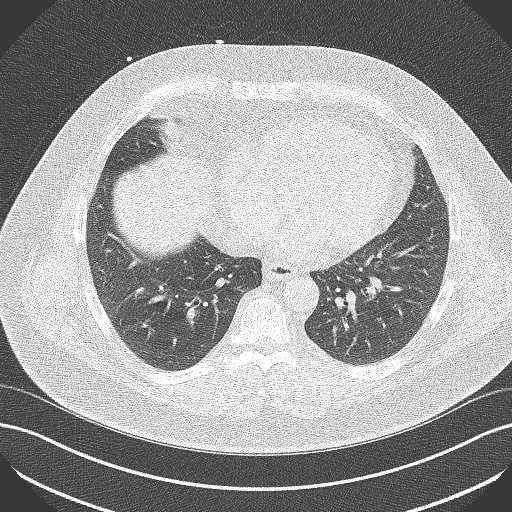
[im 25/50  lung]
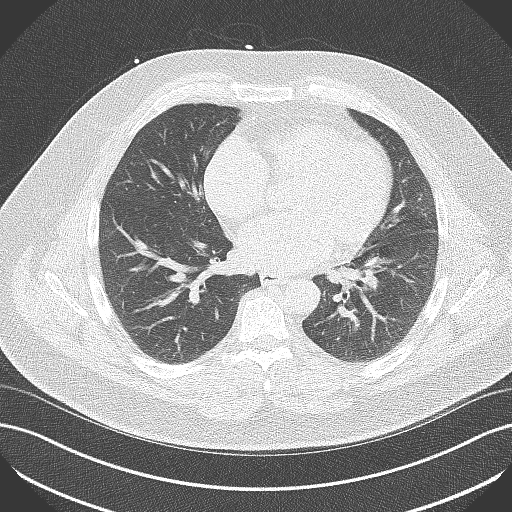
[im 33/50  lung]
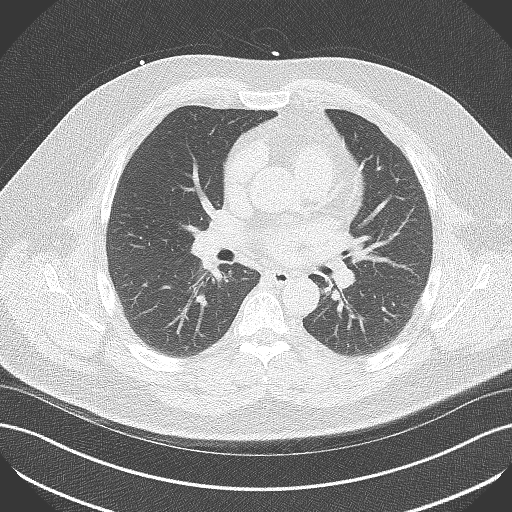
[im 41/50  lung]
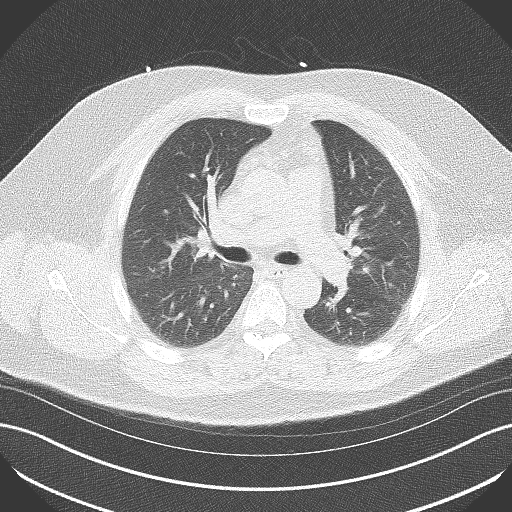

[Series 4: lung st 37 % · axial · 0.77mm/px · z∈[-373,-277]mm · 5 of 50 slices shown]
[im 9/50  lung]
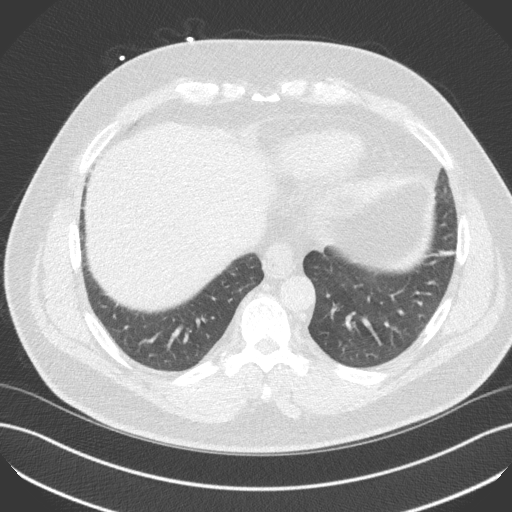
[im 17/50  lung]
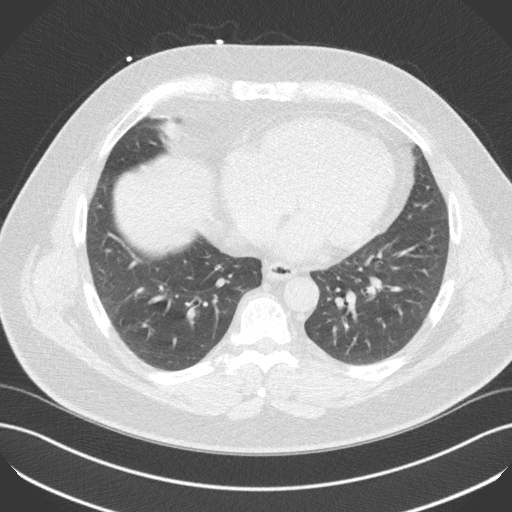
[im 25/50  lung]
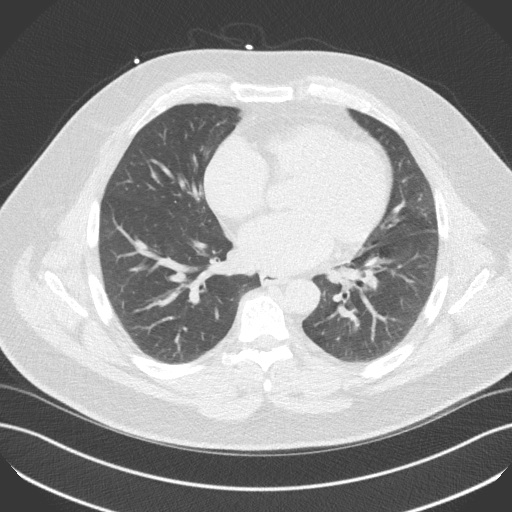
[im 33/50  lung]
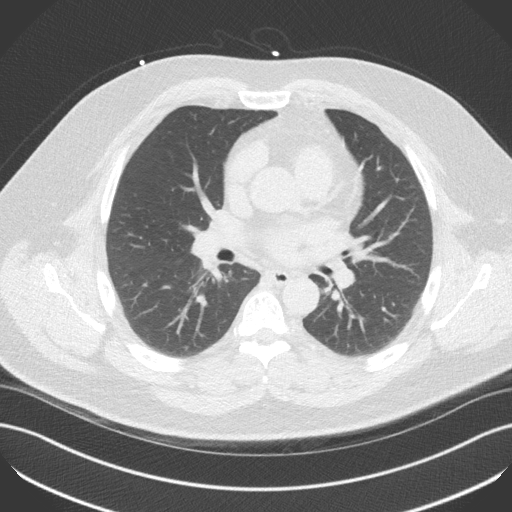
[im 41/50  lung]
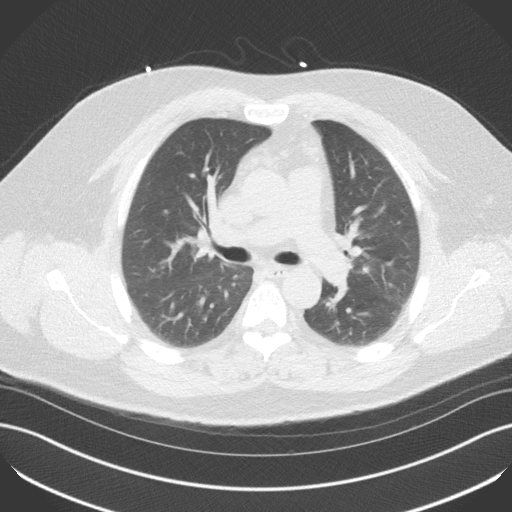

[14 of 20 positions shown; findings below may reference images not displayed]

FINDINGS: Within the visualized portions of the thorax there are no suspicious
appearing pulmonary nodules or masses, there is no acute
consolidative airspace disease, no pleural effusions, no
pneumothorax and no lymphadenopathy. Visualized portions of the
upper abdomen demonstrates diffuse low attenuation throughout the
visualized hepatic parenchyma, indicative of hepatic steatosis.
There are no aggressive appearing lytic or blastic lesions noted in
the visualized portions of the skeleton.
IMPRESSION: 1. Hepatic steatosis.
FINDINGS: Non-cardiac: See separate report from [REDACTED].

Ascending Aorta: Normal (35 mm) no atherosclerosis.

Pericardium: Normal

Coronary arteries: No calcification.  Normal origins.
IMPRESSION: 1. Coronary calcium score of 0. This was 0 percentile for age and
sex matched control. Low risk.

*** End of Addendum ***
EXAM:
OVER-READ INTERPRETATION  CT CHEST

The following report is an over-read performed by radiologist Dr.
Vitaly Ealey [REDACTED] on 06/09/2020. This
over-read does not include interpretation of cardiac or coronary
anatomy or pathology. The coronary calcium score interpretation by
the cardiologist is attached.
FINDINGS: Within the visualized portions of the thorax there are no suspicious
appearing pulmonary nodules or masses, there is no acute
consolidative airspace disease, no pleural effusions, no
pneumothorax and no lymphadenopathy. Visualized portions of the
upper abdomen demonstrates diffuse low attenuation throughout the
visualized hepatic parenchyma, indicative of hepatic steatosis.
There are no aggressive appearing lytic or blastic lesions noted in
the visualized portions of the skeleton.
IMPRESSION: 1. Hepatic steatosis.

## 2021-10-26 ENCOUNTER — Encounter: Payer: 59 | Admitting: Family Medicine

## 2021-11-08 ENCOUNTER — Ambulatory Visit (INDEPENDENT_AMBULATORY_CARE_PROVIDER_SITE_OTHER): Payer: No Typology Code available for payment source | Admitting: Family Medicine

## 2021-11-08 ENCOUNTER — Encounter: Payer: Self-pay | Admitting: Family Medicine

## 2021-11-08 VITALS — BP 120/70 | HR 100 | Temp 97.6°F | Ht 70.87 in | Wt 263.5 lb

## 2021-11-08 DIAGNOSIS — Z0000 Encounter for general adult medical examination without abnormal findings: Secondary | ICD-10-CM | POA: Diagnosis not present

## 2021-11-08 DIAGNOSIS — M25552 Pain in left hip: Secondary | ICD-10-CM

## 2021-11-08 DIAGNOSIS — K219 Gastro-esophageal reflux disease without esophagitis: Secondary | ICD-10-CM

## 2021-11-08 LAB — BASIC METABOLIC PANEL
BUN: 13 mg/dL (ref 6–23)
CO2: 28 mEq/L (ref 19–32)
Calcium: 9.3 mg/dL (ref 8.4–10.5)
Chloride: 103 mEq/L (ref 96–112)
Creatinine, Ser: 1.13 mg/dL (ref 0.40–1.50)
GFR: 77.16 mL/min (ref >60.00)
Glucose, Bld: 107 mg/dL — ABNORMAL HIGH (ref 70–99)
Potassium: 4.1 mEq/L (ref 3.5–5.1)
Sodium: 138 mEq/L (ref 135–145)

## 2021-11-08 LAB — HEPATIC FUNCTION PANEL
ALT: 40 U/L (ref 0–53)
AST: 29 U/L (ref 0–37)
Albumin: 4.4 g/dL (ref 3.5–5.2)
Alkaline Phosphatase: 54 U/L (ref 39–117)
Bilirubin, Direct: 0.1 mg/dL (ref 0.0–0.3)
Total Bilirubin: 0.7 mg/dL (ref 0.2–1.2)
Total Protein: 7.7 g/dL (ref 6.0–8.3)

## 2021-11-08 LAB — LIPID PANEL
Cholesterol: 153 mg/dL (ref 0–200)
HDL: 44.1 mg/dL (ref >39.00)
LDL Cholesterol: 87 mg/dL (ref 0–99)
NonHDL: 108.75
Total CHOL/HDL Ratio: 3
Triglycerides: 111 mg/dL (ref 0.0–149.0)
VLDL: 22.2 mg/dL (ref 0.0–40.0)

## 2021-11-08 LAB — CBC WITH DIFFERENTIAL/PLATELET
Basophils Absolute: 0.1 10*3/uL (ref 0.0–0.1)
Basophils Relative: 1.2 % (ref 0.0–3.0)
Eosinophils Absolute: 0.3 10*3/uL (ref 0.0–0.7)
Eosinophils Relative: 5.8 % — ABNORMAL HIGH (ref 0.0–5.0)
HCT: 46.9 % (ref 39.0–52.0)
Hemoglobin: 15.5 g/dL (ref 13.0–17.0)
Lymphocytes Relative: 22.7 % (ref 12.0–46.0)
Lymphs Abs: 1 10*3/uL (ref 0.7–4.0)
MCHC: 33 g/dL (ref 30.0–36.0)
MCV: 85 fl (ref 78.0–100.0)
Monocytes Absolute: 0.5 10*3/uL (ref 0.1–1.0)
Monocytes Relative: 11.4 % (ref 3.0–12.0)
Neutro Abs: 2.6 10*3/uL (ref 1.4–7.7)
Neutrophils Relative %: 58.9 % (ref 43.0–77.0)
Platelets: 253 10*3/uL (ref 150.0–400.0)
RBC: 5.52 Mil/uL (ref 4.22–5.81)
RDW: 14.4 % (ref 11.5–15.5)
WBC: 4.4 10*3/uL (ref 4.0–10.5)

## 2021-11-08 LAB — TSH: TSH: 3.02 u[IU]/mL (ref 0.35–5.50)

## 2021-11-08 NOTE — Patient Instructions (Signed)
Set up left hip film  I am setting up GI referral.

## 2021-11-08 NOTE — Progress Notes (Signed)
Established Patient Office Visit  Subjective   Patient ID: Antonio Ward, male    DOB: 1975-08-10  Age: 46 y.o. MRN: [id number]  Chief Complaint  Patient presents with   Annual Exam    HPI   Antonio Ward is seen for physical exam.  He has history of GERD and recently has had some increased symptoms even taking over-the-counter 20 mg Prilosec.  He would like to consider consulting with GI.  Has gained a few pounds of weight this past year.  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  He does relate some progressive left hip pains.  About 5 years ago had x-rays which showed moderate arthritis.  Some stiffness.  No recent injury.  Health maintenance reviewed  -Prior hepatitis C screen negative -Due for tetanus but declines at this time -No history of colonoscopy screening.  Social history-married with 2 children.  Quit smoking 2004.  No alcohol use.  Business as above.  Family history mother with history of alcohol abuse.  Father had CAD and congestive heart failure.  Past Medical History:  Diagnosis Date   GERD (gastroesophageal reflux disease)    Headache    Past Surgical History:  Procedure Laterality Date   right leg severely broken Right 1999   titanium rod, plate and 8 screws   TONSILLECTOMY  1980   WISDOM TOOTH EXTRACTION  1998    reports that he quit smoking about 18 years ago. His smoking use included cigarettes. He has a 10.00 pack-year smoking history. He has never used smokeless tobacco. He reports that he does not drink alcohol and does not use drugs. family history includes Alcohol abuse in his mother; Cancer in his paternal grandmother; Cervical cancer in his maternal grandmother; Depression in his mother; Heart disease in his father; Hypertension in his father. Allergies  Allergen Reactions   Penicillins     Per patient unknown action noted in childhood       Review of Systems  Constitutional:  Negative for chills, fever, malaise/fatigue and  weight loss.  HENT:  Negative for hearing loss.   Eyes:  Negative for blurred vision and double vision.  Respiratory:  Negative for cough and shortness of breath.   Cardiovascular:  Negative for chest pain, palpitations and leg swelling.  Gastrointestinal:  Negative for abdominal pain, blood in stool, constipation and diarrhea.  Genitourinary:  Negative for dysuria.  Skin:  Negative for rash.  Neurological:  Negative for dizziness, speech change, seizures, loss of consciousness and headaches.  Psychiatric/Behavioral:  Negative for depression.       Objective:     BP 120/70 (BP Location: Left Arm, Patient Position: Sitting, Cuff Size: Large)   Pulse 100   Temp 97.6 F (36.4 C) (Oral)   Ht 5' 10.87" (1.8 m)   Wt 263 lb 8 oz (119.5 kg)   SpO2 97%   BMI 36.89 kg/m    Physical Exam Constitutional:      General: He is not in acute distress.    Appearance: He is well-developed.  HENT:     Head: Normocephalic and atraumatic.     Right Ear: External ear normal.     Left Ear: External ear normal.  Eyes:     Conjunctiva/sclera: Conjunctivae normal.     Pupils: Pupils are equal, round, and reactive to light.  Neck:     Thyroid: No thyromegaly.  Cardiovascular:     Rate and Rhythm: Normal rate and regular rhythm.  Heart sounds: Normal heart sounds. No murmur heard. Pulmonary:     Effort: No respiratory distress.     Breath sounds: No wheezing or rales.  Abdominal:     General: Bowel sounds are normal. There is no distension.     Palpations: Abdomen is soft. There is no mass.     Tenderness: There is no abdominal tenderness. There is no guarding or rebound.  Musculoskeletal:     Cervical back: Normal range of motion and neck supple.  Lymphadenopathy:     Cervical: No cervical adenopathy.  Skin:    Findings: No rash.  Neurological:     Mental Status: He is alert and oriented to person, place, and time.     Cranial Nerves: No cranial nerve deficit.      No results  found for any visits on 11/08/21.    The ASCVD Risk score (Arnett DK, et al., 2019) failed to calculate for the following reasons:   The valid total cholesterol range is 130 to 320 mg/dL    Assessment & Plan:   Problem List Items Addressed This Visit   None Visit Diagnoses     Physical exam    -  Primary   Relevant Orders   Basic metabolic panel   Lipid panel   CBC with Differential/Platelet   Hepatic function panel   TSH   Left hip pain       Relevant Orders   DG Hip Unilat W OR W/O Pelvis 2-3 Views Left     Future order placed for hip x-ray and he will get that later  -set up GI referral.  He would like to discuss screening colonoscopy and also discuss GERD symptoms even on PPI.  Does not have any red flags such as dysphagia, appetite change, or weight loss  -Offered tetanus and he declines  -We did discuss importance of trying to lose some weight he plans to work on that soon  No follow-ups on file.    Evelena Peat, MD

## 2021-11-09 ENCOUNTER — Other Ambulatory Visit: Payer: 59

## 2021-11-09 ENCOUNTER — Ambulatory Visit (INDEPENDENT_AMBULATORY_CARE_PROVIDER_SITE_OTHER): Payer: No Typology Code available for payment source

## 2021-11-09 DIAGNOSIS — M25552 Pain in left hip: Secondary | ICD-10-CM | POA: Diagnosis not present

## 2021-11-19 ENCOUNTER — Encounter: Payer: Self-pay | Admitting: Nurse Practitioner

## 2021-12-23 ENCOUNTER — Encounter: Payer: Self-pay | Admitting: Nurse Practitioner

## 2021-12-23 ENCOUNTER — Ambulatory Visit (INDEPENDENT_AMBULATORY_CARE_PROVIDER_SITE_OTHER): Payer: No Typology Code available for payment source | Admitting: Nurse Practitioner

## 2021-12-23 VITALS — BP 90/70 | HR 80 | Ht 72.0 in | Wt 263.2 lb

## 2021-12-23 DIAGNOSIS — K219 Gastro-esophageal reflux disease without esophagitis: Secondary | ICD-10-CM

## 2021-12-23 DIAGNOSIS — Z1211 Encounter for screening for malignant neoplasm of colon: Secondary | ICD-10-CM

## 2021-12-23 MED ORDER — PLENVU 140 G PO SOLR: 1.0000 | Freq: Once | ORAL | 0 refills | Status: AC

## 2021-12-23 NOTE — Patient Instructions (Signed)
_______________________________________________________  If you are age 48 or older, your body mass index should be between 23-30. Your Body mass index is 35.7 kg/m. If this is out of the aforementioned range listed, please consider follow up with your Primary Care Provider.  If you are age 31 or younger, your body mass index should be between 19-25. Your Body mass index is 35.7 kg/m. If this is out of the aformentioned range listed, please consider follow up with your Primary Care Provider.   ________________________________________________________  The [facility] providers would like to encourage you to use [facility] to communicate with providers for non-urgent requests or questions.  Due to long hold times on the telephone, sending your provider a message by [facility] may be a faster and more efficient way to get a response.  Please allow 48 business hours for a response.  Please remember that this is for non-urgent requests.  _______________________________________________________  Antonio Ward have been scheduled for an endoscopy and colonoscopy. Please follow the written instructions given to you at your visit today. Please pick up your prep supplies at the pharmacy within the next 1-3 days. If you use inhalers (even only as needed), please bring them with you on the day of your procedure.

## 2021-12-23 NOTE — Progress Notes (Signed)
Addendum: Reviewed and agree with assessment and management plan. Teejay Meader M, MD  

## 2021-12-23 NOTE — Progress Notes (Signed)
12/23/2021 [name] [id number] 01/26/1976   CHIEF COMPLAINT: GERD, schedule a screening colonoscopy   HISTORY OF PRESENT ILLNESS: Jeneen Rinks L. Hensley is a 46 year old male with a past medical history of GERD.  Past tonsillectomy, reported tonsils grew back.  He presents to our office today as referred by Dr. Beverly Gust for further evaluation regarding GERD and to schedule a screening colonoscopy.  He developed GERD symptoms 8 years ago he was traveling on an airplane.  At that time, he described feeling mid chest pain which initially went away after he drank icy water.  Later that night, he ate dinner and developed severe indigestion which felt like a heart attack.  He went to the local ED and cardiac evaluation was negative.  He reported undergoing a stress test which was negative as well.  He was treated with Omeprazole 20mg  QD for GERD.  He had recurrence of reflux symptoms and was seen by Ellouise Newer, PA-C in office 12/29/2017.  At that time, Omeprazole was increased to 20 mg twice daily and an EGD was recommended but he declined at that time.  He reported taking Omeprazole 20 mg twice daily for about 1 month then reduced Omeprazole back down to once daily as his symptoms abated.  Since then, his GERD symptoms were fairly well controlled until 1 month ago while driving he felt significant abdominal bloat with burping.  He had mild nausea at the time without vomiting.  His wife who is an Therapist, sports recommended he increase Omeprazole 20 mg to twice daily and his symptoms abated.  He stated his wife who sees Dr. Fuller Plan was diagnosed with H. pylori 1 year ago which was treated.  He has gained 50 pounds over the past year which he is acknowledged and he is trying to lose weight.  He is exercising on a daily basis.  His mother had significant GERD treated with magnets to the LES, likely LINX procedure.  He is passing normal formed brown bowel movement daily.  History of rectal bleeding in 2019, likely due to  hemorrhoids.  No  recent rectal bleeding. No black stools.  No known family history of esophageal, gastric or colon cancer.  No chest pain or shortness of breath with exertion.  He reported being difficult to sedate during a past dental surgery otherwise no problems with sedation or anesthesia.       Latest Ref Rng & Units 11/08/2021    9:47 AM 05/19/2020   11:42 AM 06/07/2019    9:45 AM  CBC  WBC 4.0 - 10.5 K/uL 4.4  5.2  4.6   Hemoglobin 13.0 - 17.0 g/dL 15.5  15.6  15.7   Hematocrit 39.0 - 52.0 % 46.9  45.7  46.8   Platelets 150.0 - 400.0 K/uL 253.0  257.0  279.0        Latest Ref Rng & Units 11/08/2021    9:47 AM 05/19/2020   11:42 AM 06/07/2019    9:45 AM  CMP  Glucose 70 - 99 mg/dL 107  89  103   BUN 6 - 23 mg/dL 13  13  16    Creatinine 0.40 - 1.50 mg/dL 1.13  1.00  1.11   Sodium 135 - 145 mEq/L 138  137  138   Potassium 3.5 - 5.1 mEq/L 4.1  4.1  4.5   Chloride 96 - 112 mEq/L 103  104  102   CO2 19 - 32 mEq/L 28  25  30  Calcium 8.4 - 10.5 mg/dL 9.3  8.9  9.5   Total Protein 6.0 - 8.3 g/dL 7.7  7.3  7.6   Total Bilirubin 0.2 - 1.2 mg/dL 0.7  0.6  0.6   Alkaline Phos 39 - 117 U/L 54  52  59   AST 0 - 37 U/L 29  19  31    ALT 0 - 53 U/L 40  23  42      Past Medical History:  Diagnosis Date   GERD (gastroesophageal reflux disease)    Headache    Past Surgical History:  Procedure Laterality Date   right leg severely broken Right 1999   titanium rod, plate and 8 screws   TONSILLECTOMY  1980   WISDOM TOOTH EXTRACTION  1998   Social History: He is married.  He is self-employed.  He quit smoking cigarettes 20 years ago.  No alcohol use.  No drug use.  Family History: Father with history of hypertension, heart disease and CHF.  Mother with history of alcohol abuse.  Paternal grandmother with history of cervical cancer.   No known family history of esophageal, gastric or colon cancer.  Allergies  Allergen Reactions   Penicillins     Per patient unknown action noted in  childhood      Outpatient Encounter Medications as of 12/23/2021  Medication Sig   omeprazole (PRILOSEC OTC) 20 MG tablet Take 1 tablet (20 mg total) by mouth daily. (Patient taking differently: Take 40 mg by mouth daily.)   No facility-administered encounter medications on file as of 12/23/2021.    REVIEW OF SYSTEMS:  Gen: Denies fever, sweats or chills. No weight loss.  CV: Denies chest pain, palpitations or edema. Resp: Denies cough, shortness of breath of hemoptysis.  GI: See HPI.   GU : Denies urinary burning, blood in urine, increased urinary frequency or incontinence. MS: Left hip pain.  Derm: Denies rash, itchiness, skin lesions or unhealing ulcers. Psych: Denies depression, anxiety or memory loss. Heme: Denies bruising, easy bleeding. Neuro:  Denies headaches, dizziness or paresthesias. Endo:  Denies any problems with DM, thyroid or adrenal function.  PHYSICAL EXAM: BP 90/70   Pulse 80   Ht 6' (1.829 m)   Wt 263 lb 4 oz (119.4 kg)   BMI 35.70 kg/m  Wt Readings from Last 3 Encounters:  12/23/21 263 lb 4 oz (119.4 kg)  11/08/21 263 lb 8 oz (119.5 kg)  04/08/21 273 lb 3.2 oz (123.9 kg)    General: 46 year old male in no acute distress. Head: Normocephalic and atraumatic. Eyes:  Sclerae non-icteric, conjunctive pink. Ears: Normal auditory acuity. Mouth: Dentition intact. No ulcers or lesions.  Neck: Supple, no lymphadenopathy or thyromegaly.  Lungs: Clear bilaterally to auscultation without wheezes, crackles or rhonchi. Heart: Regular rate and rhythm. No murmur, rub or gallop appreciated.  Abdomen: Soft, nontender, non distended. No masses. No hepatosplenomegaly. Normoactive bowel sounds x 4 quadrants.  Rectal: Deferred. Musculoskeletal: Symmetrical with no gross deformities. Skin: Warm and dry. No rash or lesions on visible extremities. Extremities: No edema. Neurological: Alert oriented x 4, no focal deficits.  Psychological:  Alert and cooperative. Normal mood  and affect.  ASSESSMENT AND PLAN:  61) 46 year old male with GERD symptoms, initially diagnosed 8 years ago.  He recently had abdominal bloat with increased burping which significantly improved after he increased Omeprazole 20 mg from once daily to twice daily dosing. -EGD benefits and risks discussed including risk with sedation, risk of bleeding, perforation and infection  -  GERD Handout -Exercise as tolerated and lose 10 pounds over the next 4 to 6 months -Omeprazole 20 mg once daily, may take twice daily -Consider abdominal ultrasound to evaluate the gallbladder if EGD unrevealing  2) Colon cancer screening  -Colonoscopy benefits and risks discussed including risk with sedation, risk of bleeding, perforation and infection   Further recommendations to be determined after the above evaluation completed       CC:  Burchette, Alinda Sierras, MD

## 2022-01-25 ENCOUNTER — Encounter: Payer: No Typology Code available for payment source | Admitting: Internal Medicine

## 2022-01-26 ENCOUNTER — Ambulatory Visit: Payer: 59 | Admitting: Internal Medicine

## 2022-01-27 ENCOUNTER — Encounter (HOSPITAL_COMMUNITY): Payer: Self-pay | Admitting: *Deleted

## 2022-01-27 ENCOUNTER — Telehealth: Payer: Self-pay

## 2022-01-27 ENCOUNTER — Ambulatory Visit (HOSPITAL_COMMUNITY)
Admission: EM | Admit: 2022-01-27 | Discharge: 2022-01-27 | Disposition: A | Discharge status: Home / Self Care | Payer: No Typology Code available for payment source | Attending: Internal Medicine | Admitting: Internal Medicine

## 2022-01-27 ENCOUNTER — Ambulatory Visit (INDEPENDENT_AMBULATORY_CARE_PROVIDER_SITE_OTHER): Payer: No Typology Code available for payment source

## 2022-01-27 DIAGNOSIS — R062 Wheezing: Secondary | ICD-10-CM

## 2022-01-27 DIAGNOSIS — J019 Acute sinusitis, unspecified: Secondary | ICD-10-CM

## 2022-01-27 DIAGNOSIS — B9689 Other specified bacterial agents as the cause of diseases classified elsewhere: Secondary | ICD-10-CM | POA: Diagnosis not present

## 2022-01-27 DIAGNOSIS — R0981 Nasal congestion: Secondary | ICD-10-CM

## 2022-01-27 MED ORDER — BENZONATATE 100 MG PO CAPS: 100.0000 mg | ORAL_CAPSULE | Freq: Three times a day (TID) | ORAL | 0 refills | Status: DC

## 2022-01-27 MED ORDER — DOXYCYCLINE HYCLATE 100 MG PO CAPS: 100.0000 mg | ORAL_CAPSULE | Freq: Two times a day (BID) | ORAL | 0 refills | Status: AC

## 2022-01-27 MED ORDER — ALBUTEROL SULFATE HFA 108 (90 BASE) MCG/ACT IN AERS: 1.0000 | INHALATION_SPRAY | Freq: Four times a day (QID) | RESPIRATORY_TRACT | 0 refills | Status: DC | PRN

## 2022-01-27 MED ORDER — PREDNISONE 20 MG PO TABS: 40.0000 mg | ORAL_TABLET | Freq: Every day | ORAL | 0 refills | Status: AC

## 2022-01-27 MED ORDER — GUAIFENESIN ER 1200 MG PO TB12: 1200.0000 mg | ORAL_TABLET | Freq: Two times a day (BID) | ORAL | 0 refills | Status: DC

## 2022-01-27 NOTE — Telephone Encounter (Signed)
-  Caller states her husband has been sick since last Friday, has nasal congestion, cough and SOB, is having SOB now, denies fever and other symptoms  01/27/2022 9:47:02 AM Go to ED Now Nicki Reaper, RN, Malachy Mood  Comments User: Burna Sis, RN Date/Time Eilene Ghazi Time): 01/27/2022 9:47:29 AM Refused ER going to [facility]  Referrals [facility] REFUSED  01/27/22 at 1044 - Offered pt VV, however, pt states he feels SOB at rest. Pt states he's going to UC. Will cancel appt with PCP for 01/28/22; pt aware.

## 2022-01-27 NOTE — ED Provider Notes (Signed)
[facility]    CSN: [id number] Arrival date & time: 01/27/22  1216      History   Chief Complaint Chief Complaint  Patient presents with   Nasal Congestion    HPI Antonio Ward is a 46 y.o. male.   Patient presents to urgent care for evaluation of nasal congestion, sinus pressure, fatigue, and shortness of breath that started 8 days ago. Symptoms started while patient was in his garage around a lot of dust and he believes that this contributed to his sinus pressure/congestion initially.  When symptoms first started, he took Sudafed for a couple of days but states that this was not helping very much.  His wife then gave him Afrin and he states he used this 2 times on 2 separate days but then experienced rebound nasal congestion after using this.  Wife is an Char[employer] fundraiser and recommended that patient use his child's albuterol breathing treatment a couple of nights ago for shortness of breath.  Albuterol breathing treatment helped him be able to breathe well enough to go to sleep.  He denies orthopnea, chest pain, cough, dizziness, nausea, vomiting, abdominal pain, and weakness.  He was a smoker for 10 years earlier in his life but quit 20 years ago.  Denies other drug use.  Denies history of chronic respiratory problems including asthma.  He does not take any medications on a daily basis for chronic medical problems.  He took 2 COVID-19 test at home both of which were negative.  He is concerned that he may have the flu, although he is afebrile and denies known sick contacts.  After lack of relief of symptoms with Sudafed and Afrin, he began taking DayQuil and Alka-Seltzer plus flu medications without very much relief of symptoms.      Past Medical History:  Diagnosis Date   GERD (gastroesophageal reflux disease)    Headache     Patient Active Problem List   Diagnosis Date Noted   Headache syndrome 09/04/2020   Iliotibial band syndrome of left side 12/19/2016   Obesity (BMI 30-39.9)  12/20/2013   GERD (gastroesophageal reflux disease) 06/25/2012    Past Surgical History:  Procedure Laterality Date   right leg severely broken Right 1999   titanium rod, plate and 8 screws   TONSILLECTOMY  1980   WISDOM TOOTH EXTRACTION  1998       Home Medications    Prior to Admission medications   Medication Sig Start Date End Date Taking? Authorizing Provider  albuterol (VENTOLIN HFA) 108 (90 Base) MCG/ACT inhaler Inhale 1-2 puffs into the lungs every 6 (six) hours as needed for wheezing or shortness of breath. 01/27/22  Yes Carlisle Beers, FNP  benzonatate (TESSALON) 100 MG capsule Take 1 capsule (100 mg total) by mouth every 8 (eight) hours. 01/27/22  Yes Carlisle Beers, FNP  doxycycline (VIBRAMYCIN) 100 MG capsule Take 1 capsule (100 mg total) by mouth 2 (two) times daily for 7 days. 01/27/22 02/03/22 Yes StanhopeDonavan Burnet, FNP  Guaifenesin 1200 MG TB12 Take 1 tablet (1,200 mg total) by mouth in the morning and at bedtime. 01/27/22  Yes Carlisle Beers, FNP  omeprazole (PRILOSEC OTC) 20 MG tablet Take 1 tablet (20 mg total) by mouth daily. Patient taking differently: Take 40 mg by mouth daily. 01/11/18  Yes Unk Lightning, PA  predniSONE (DELTASONE) 20 MG tablet Take 2 tablets (40 mg total) by mouth daily for 5 days. 01/27/22 02/01/22 Yes Kumar Falwell, Donavan Burnet, FNP  Family History Family History  Problem Relation Age of Onset   Alcohol abuse Mother    Depression Mother    Heart disease Father    Hypertension Father    Cervical cancer Maternal Grandmother    Cancer Paternal Grandmother        cervical ??   Colon cancer Neg Hx    Esophageal cancer Neg Hx     Social History Social History   Tobacco Use   Smoking status: Former    Packs/day: 0.50    Years: 20.00    Total pack years: 10.00    Types: Cigarettes    Quit date: 01/26/2003    Years since quitting: 19.0   Smokeless tobacco: Never  Vaping Use   Vaping Use: Never used   Substance Use Topics   Alcohol use: No   Drug use: No     Allergies   Penicillins   Review of Systems Review of Systems Per HPI  Physical Exam Triage Vital Signs ED Triage Vitals  Enc Vitals Group     BP 01/27/22 1324 135/85     Pulse Rate 01/27/22 1324 90     Resp 01/27/22 1324 20     Temp 01/27/22 1324 97.7 F (36.5 C)     Temp Source 01/27/22 1324 Oral     SpO2 01/27/22 1324 93 %     Weight --      Height --      Head Circumference --      Peak Flow --      Pain Score 01/27/22 1323 0     Pain Loc --      Pain Edu? --      Excl. in GC? --    No data found.  Updated Vital Signs BP 135/85 (BP Location: Right Arm)   Pulse 90   Temp 97.7 F (36.5 C) (Oral)   Resp 20   SpO2 93%   Visual Acuity Right Eye Distance:   Left Eye Distance:   Bilateral Distance:    Right Eye Near:   Left Eye Near:    Bilateral Near:     Physical Exam Vitals and nursing note reviewed.  Constitutional:      Appearance: He is ill-appearing. He is not toxic-appearing.  HENT:     Head: Normocephalic and atraumatic.     Right Ear: Hearing, tympanic membrane, ear canal and external ear normal.     Left Ear: Hearing, tympanic membrane, ear canal and external ear normal.     Nose: Congestion present.     Right Sinus: Maxillary sinus tenderness present.     Left Sinus: Maxillary sinus tenderness present.     Mouth/Throat:     Lips: Pink.     Mouth: Mucous membranes are moist.     Pharynx: No posterior oropharyngeal erythema.  Eyes:     General: Lids are normal. Vision grossly intact. Gaze aligned appropriately.        Right eye: No discharge.        Left eye: No discharge.     Extraocular Movements: Extraocular movements intact.     Conjunctiva/sclera: Conjunctivae normal.     Pupils: Pupils are equal, round, and reactive to light.  Cardiovascular:     Rate and Rhythm: Normal rate and regular rhythm.     Heart sounds: Normal heart sounds, S1 normal and S2 normal.   Pulmonary:     Effort: Pulmonary effort is normal. No tachypnea, prolonged expiration or respiratory distress.  Breath sounds: Normal air entry. Examination of the left-upper field reveals wheezing. Examination of the left-lower field reveals rales. Wheezing and rales present. No decreased breath sounds or rhonchi.     Comments: Focal wheezing heard to the left upper lung field and focal rales heard to the left lower lung field.  Musculoskeletal:     Cervical back: Neck supple.  Lymphadenopathy:     Cervical: Cervical adenopathy present.  Skin:    General: Skin is warm and dry.     Capillary Refill: Capillary refill takes less than 2 seconds.     Findings: No rash.  Neurological:     General: No focal deficit present.     Mental Status: He is alert and oriented to person, place, and time. Mental status is at baseline.     Cranial Nerves: No dysarthria or facial asymmetry.  Psychiatric:        Mood and Affect: Mood normal.        Speech: Speech normal.        Behavior: Behavior normal.        Thought Content: Thought content normal.        Judgment: Judgment normal.      UC Treatments / Results  Labs (all labs ordered are listed, but only abnormal results are displayed) Labs Reviewed - No data to display  EKG   Radiology DG Chest 2 View  Result Date: 01/27/2022 CLINICAL DATA:  Wheezing EXAM: CHEST - 2 VIEW COMPARISON:  Chest radiograph 01/18/2011 FINDINGS: The cardiomediastinal silhouette is normal. There is no focal consolidation or pulmonary edema. There is no pleural effusion or pneumothorax There is no acute osseous abnormality. IMPRESSION: No radiographic evidence of acute cardiopulmonary process. Electronically Signed   By: Lesia Hausen M.D.   On: 01/27/2022 14:08    Procedures Procedures (including critical care time)  Medications Ordered in UC Medications - No data to display  Initial Impression / Assessment and Plan / UC Course  I have reviewed the triage  vital signs and the nursing notes.  Pertinent labs & imaging results that were available during my care of the patient were reviewed by me and considered in my medical decision making (see chart for details).   1. Acute bacterial sinusitis Symptoms have been persistent for the last 8 days and have not responded well to over-the-counter therapy, therefore we will provide antibiotic prescription for postviral acute bacterial sinusitis.  Patient is allergic to penicillins, therefore doxycycline sent to pharmacy to be taken twice daily with food for 7 days.  Patient to use saline nasal spray to reduce mucosal edema and take guaifenesin every 12 hours to thin mucus.  May take Tylenol every 6 hours as needed for headache.  Warm compresses to the sinuses may be used for comfort.    2.  Wheezes Chest x-ray is negative for acute cardiopulmonary abnormality.  Patient likely has concurrent bronchitis along with acute bacterial sinusitis.  Prednisone burst prescribed to be taken once daily for the next 5 days.  First dose of prednisone burst given in clinic.  He is to avoid NSAIDs over the next few days while taking prednisone due to increased risk of GI bleeding.  He is to take prednisone with food to avoid stomach upset.  Albuterol inhaler may be used every 4-6 hours as needed for cough, shortness of breath, and wheeze.  Patient to rest, increase fluids, and take medicines to help with symptomatic relief over the next few days while he recovers from  this illness.  Discussed physical exam and available lab work findings in clinic with patient.  Counseled patient regarding appropriate use of medications and potential side effects for all medications recommended or prescribed today. Discussed red flag signs and symptoms of worsening condition,when to call the PCP office, return to urgent care, and when to seek higher level of care in the emergency department. Patient verbalizes understanding and agreement with plan.  All questions answered. Patient discharged in stable condition.    Final Clinical Impressions(s) / UC Diagnoses   Final diagnoses:  Acute bacterial sinusitis  Nasal congestion  Wheezes     Discharge Instructions      You have a post-viral bacterial infection of your sinuses.  Your chest x-ray is negative for pneumothorax and pneumonia.   Use albuterol inhaler every 4-6 hours as needed for wheezing and shortness of breath.  You may use tessalon pearles every 8 hours as needed for cough.  Take doxycycline antibiotic 2 times daily for the next 7 days with food.  Prednisone 40mg  once daily for the next 5 days starting today. Your first dose may be when you get home, then daily with breakfast for the next few days. No ibuprofen while you take prednisone.   Take guaifenesin to thin mucous. Place humidifier in your room to add water to the air and help with congestion, cough, and shortness of breath.   Use saline nasal spray to your nose to reduce swelling.   If you develop any new or worsening symptoms or do not improve in the next 2 to 3 days, please return.  If your symptoms are severe, please go to the emergency room.  Follow-up with your primary care provider for further evaluation and management of your symptoms as well as ongoing wellness visits.  I hope you feel better!     ED Prescriptions     Medication Sig Dispense Auth. Provider   doxycycline (VIBRAMYCIN) 100 MG capsule Take 1 capsule (100 mg total) by mouth 2 (two) times daily for 7 days. 14 capsule Joella Prince M, FNP   predniSONE (DELTASONE) 20 MG tablet Take 2 tablets (40 mg total) by mouth daily for 5 days. 10 tablet Talbot Grumbling, FNP   Guaifenesin 1200 MG TB12 Take 1 tablet (1,200 mg total) by mouth in the morning and at bedtime. 14 tablet Joella Prince M, FNP   benzonatate (TESSALON) 100 MG capsule Take 1 capsule (100 mg total) by mouth every 8 (eight) hours. 21 capsule Joella Prince  M, FNP   albuterol (VENTOLIN HFA) 108 (90 Base) MCG/ACT inhaler Inhale 1-2 puffs into the lungs every 6 (six) hours as needed for wheezing or shortness of breath. 8 g Talbot Grumbling, FNP      PDMP not reviewed this encounter.   Talbot Grumbling, [place] 01/29/22 1115

## 2022-01-27 NOTE — Discharge Instructions (Addendum)
You have a post-viral bacterial infection of your sinuses.  Your chest x-ray is negative for pneumothorax and pneumonia.   Use albuterol inhaler every 4-6 hours as needed for wheezing and shortness of breath.  You may use tessalon pearles every 8 hours as needed for cough.  Take doxycycline antibiotic 2 times daily for the next 7 days with food.  Prednisone 40mg  once daily for the next 5 days starting today. Your first dose may be when you get home, then daily with breakfast for the next few days. No ibuprofen while you take prednisone.   Take guaifenesin to thin mucous. Place humidifier in your room to add water to the air and help with congestion, cough, and shortness of breath.   Use saline nasal spray to your nose to reduce swelling.   If you develop any new or worsening symptoms or do not improve in the next 2 to 3 days, please return.  If your symptoms are severe, please go to the emergency room.  Follow-up with your primary care provider for further evaluation and management of your symptoms as well as ongoing wellness visits.  I hope you feel better!

## 2022-01-27 NOTE — ED Triage Notes (Signed)
Pt states last Thursday he was cleaning out garage and started getting congested he took sudafed. In the last two days he has got worse his wife gave him a albuterol neb treatment that is his kids. He has been using Afrin but it made it worse. He did a neb treatment this morning and took sudafed. He did 2 covid test and both neg.

## 2022-01-28 ENCOUNTER — Ambulatory Visit: Payer: No Typology Code available for payment source | Admitting: Family Medicine

## 2022-02-20 ENCOUNTER — Ambulatory Visit (HOSPITAL_COMMUNITY)
Admission: EM | Admit: 2022-02-20 | Discharge: 2022-02-20 | Disposition: A | Discharge status: Home / Self Care | Payer: No Typology Code available for payment source | Attending: Internal Medicine | Admitting: Internal Medicine

## 2022-02-20 ENCOUNTER — Encounter (HOSPITAL_COMMUNITY): Payer: Self-pay

## 2022-02-20 DIAGNOSIS — T7840XA Allergy, unspecified, initial encounter: Secondary | ICD-10-CM | POA: Diagnosis not present

## 2022-02-20 DIAGNOSIS — R062 Wheezing: Secondary | ICD-10-CM

## 2022-02-20 DIAGNOSIS — R0981 Nasal congestion: Secondary | ICD-10-CM | POA: Diagnosis not present

## 2022-02-20 DIAGNOSIS — R0602 Shortness of breath: Secondary | ICD-10-CM | POA: Diagnosis not present

## 2022-02-20 MED ORDER — IPRATROPIUM-ALBUTEROL 0.5-2.5 (3) MG/3ML IN SOLN: 3.0000 mL | Freq: Once | RESPIRATORY_TRACT | Status: DC

## 2022-02-20 MED ORDER — ALBUTEROL SULFATE (2.5 MG/3ML) 0.083% IN NEBU
2.5000 mg | INHALATION_SOLUTION | Freq: Once | RESPIRATORY_TRACT | Status: AC
  Administered 2022-02-20: 2.5 mg via RESPIRATORY_TRACT

## 2022-02-20 MED ORDER — PREDNISONE 20 MG PO TABS
40.0000 mg | ORAL_TABLET | Freq: Once | ORAL | Status: AC
  Administered 2022-02-20: 40 mg via ORAL

## 2022-02-20 MED ORDER — PREDNISONE 20 MG PO TABS: 40.0000 mg | ORAL_TABLET | Freq: Every day | ORAL | 0 refills | Status: AC

## 2022-02-20 MED ORDER — PREDNISONE 20 MG PO TABS
ORAL_TABLET | ORAL | Status: AC
  Filled 2022-02-20: qty 2

## 2022-02-20 MED ORDER — CETIRIZINE HCL 10 MG PO TABS: 10.0000 mg | ORAL_TABLET | Freq: Every day | ORAL | 2 refills | Status: DC

## 2022-02-20 MED ORDER — ALBUTEROL SULFATE (2.5 MG/3ML) 0.083% IN NEBU
INHALATION_SOLUTION | RESPIRATORY_TRACT | Status: AC
  Filled 2022-02-20: qty 3

## 2022-02-20 NOTE — ED Provider Notes (Signed)
[facility]    CSN: [id number] Arrival date & time: 02/20/22  1048      History   Chief Complaint Chief Complaint  Patient presents with   Sinus Problem    HPI Antonio Ward is a 46 y.o. male.   Patient presents urgent care for evaluation of shortness of breath, wheeze, mild cough, and nasal congestion that started 3 days ago on Friday, November 24th 2023.  Patient was seen in urgent care for similar symptoms at the beginning of the month on January 27, 2022 after cleaning out half of his garage and developing significant wheeze, shortness of breath, and nasal congestion.  Symptoms had been present for greater than 10 days at time of visit, therefore patient was prescribed doxycycline for bacterial sinus infection.  Patient completed course of Augmentin as prescribed.  He was also prescribed albuterol inhaler and prednisone due to cough, shortness of breath, and wheeze.  He states these medications helped significantly with the symptoms and they completely resolved.  4 days ago, he cleaned out the rest of his garage but wore a droplet mask at this time to prevent possible allergic reaction to the dust that had been sitting there for 10 years but states the mask did not help and now his symptoms have returned.  He used his child's expired albuterol nebulizer treatment a few times over the last 3 days to help with shortness of breath and states that this helped significantly.  Last use of albuterol nebulizer treatment was shortly prior to arrival urgent care.  After using the albuterol treatment for shortness of breath, patient developed significant nasal congestion and inability to breathe out of his nose. Has been taking guaifenesin in attempt to thin mucous without much relief. Reports he is allergic to dogs, but has a pet dog in the home. No other allergens identified.    Sinus Problem    Past Medical History:  Diagnosis Date   GERD (gastroesophageal reflux disease)     Headache     Patient Active Problem List   Diagnosis Date Noted   Headache syndrome 09/04/2020   Iliotibial band syndrome of left side 12/19/2016   Obesity (BMI 30-39.9) 12/20/2013   GERD (gastroesophageal reflux disease) 06/25/2012    Past Surgical History:  Procedure Laterality Date   right leg severely broken Right 1999   titanium rod, plate and 8 screws   TONSILLECTOMY  1980   WISDOM TOOTH EXTRACTION  1998       Home Medications    Prior to Admission medications   Medication Sig Start Date End Date Taking? Authorizing Provider  albuterol (VENTOLIN HFA) 108 (90 Base) MCG/ACT inhaler Inhale 1-2 puffs into the lungs every 6 (six) hours as needed for wheezing or shortness of breath. 01/27/22  Yes Carlisle Beers, FNP  cetirizine (ZYRTEC) 10 MG tablet Take 1 tablet (10 mg total) by mouth daily. 02/20/22  Yes Carlisle Beers, FNP  Guaifenesin 1200 MG TB12 Take 1 tablet (1,200 mg total) by mouth in the morning and at bedtime. 01/27/22  Yes Carlisle Beers, FNP  omeprazole (PRILOSEC OTC) 20 MG tablet Take 1 tablet (20 mg total) by mouth daily. Patient taking differently: Take 40 mg by mouth daily. 01/11/18  Yes Unk Lightning, PA  predniSONE (DELTASONE) 20 MG tablet Take 2 tablets (40 mg total) by mouth daily for 4 days. 02/20/22 02/24/22 Yes StanhopeDonavan Burnet, FNP  benzonatate (TESSALON) 100 MG capsule Take 1 capsule (100 mg total) by  mouth every 8 (eight) hours. 01/27/22   Carlisle BeersStanhope, Adeeb Konecny M, FNP    Family History Family History  Problem Relation Age of Onset   Alcohol abuse Mother    Depression Mother    Heart disease Father    Hypertension Father    Cervical cancer Maternal Grandmother    Cancer Paternal Grandmother        cervical ??   Colon cancer Neg Hx    Esophageal cancer Neg Hx     Social History Social History   Tobacco Use   Smoking status: Former    Packs/day: 0.50    Years: 20.00    Total pack years: 10.00    Types:  Cigarettes    Quit date: 01/26/2003    Years since quitting: 19.0   Smokeless tobacco: Never  Vaping Use   Vaping Use: Never used  Substance Use Topics   Alcohol use: No   Drug use: No     Allergies   Penicillins   Review of Systems Review of Systems Per HPI  Physical Exam Triage Vital Signs ED Triage Vitals  Enc Vitals Group     BP 02/20/22 1206 121/73     Pulse Rate 02/20/22 1206 85     Resp 02/20/22 1206 16     Temp 02/20/22 1206 99 F (37.2 C)     Temp Source 02/20/22 1206 Axillary     SpO2 02/20/22 1206 91 %     Weight --      Height --      Head Circumference --      Peak Flow --      Pain Score 02/20/22 1205 0     Pain Loc --      Pain Edu? --      Excl. in GC? --    No data found.  Updated Vital Signs BP 121/73 (BP Location: Right Arm)   Pulse 85   Temp 99 F (37.2 C) (Axillary)   Resp 16   SpO2 91%   Visual Acuity Right Eye Distance:   Left Eye Distance:   Bilateral Distance:    Right Eye Near:   Left Eye Near:    Bilateral Near:     Physical Exam Vitals and nursing note reviewed.  Constitutional:      Appearance: He is not ill-appearing or toxic-appearing.  HENT:     Head: Normocephalic and atraumatic.     Right Ear: Hearing, tympanic membrane, ear canal and external ear normal.     Left Ear: Hearing, tympanic membrane, ear canal and external ear normal.     Nose: Congestion present.     Comments: Significant nasal congestion with maxillary sinus pressure and tenderness.    Mouth/Throat:     Lips: Pink.     Mouth: Mucous membranes are moist.     Pharynx: Posterior oropharyngeal erythema present.     Comments: Small amount of clear postnasal drainage visualized to the posterior oropharynx.  Eyes:     General: Lids are normal. Vision grossly intact. Gaze aligned appropriately.        Right eye: No discharge.        Left eye: No discharge.     Extraocular Movements: Extraocular movements intact.     Conjunctiva/sclera: Conjunctivae  normal.  Cardiovascular:     Rate and Rhythm: Normal rate and regular rhythm.     Heart sounds: Normal heart sounds, S1 normal and S2 normal.  Pulmonary:     Effort: Pulmonary effort  is normal. No respiratory distress.     Breath sounds: Normal air entry. Wheezing present.     Comments: Localized expiratory wheeze heard to the left lower lung field with decreased lung sounds throughout, no other adventitious lung sounds heard to auscultation. No respiratory distress.  Musculoskeletal:     Cervical back: Neck supple.  Lymphadenopathy:     Cervical: No cervical adenopathy.  Skin:    General: Skin is warm and dry.     Capillary Refill: Capillary refill takes less than 2 seconds.     Findings: No rash.  Neurological:     General: No focal deficit present.     Mental Status: He is alert and oriented to person, place, and time. Mental status is at baseline.     Cranial Nerves: No dysarthria or facial asymmetry.  Psychiatric:        Mood and Affect: Mood normal.        Speech: Speech normal.        Behavior: Behavior normal.        Thought Content: Thought content normal.        Judgment: Judgment normal.      UC Treatments / Results  Labs (all labs ordered are listed, but only abnormal results are displayed) Labs Reviewed - No data to display  EKG   Radiology No results found.  Procedures Procedures (including critical care time)  Medications Ordered in UC Medications  albuterol (PROVENTIL) (2.5 MG/3ML) 0.083% nebulizer solution 2.5 mg (2.5 mg Nebulization Given 02/20/22 1304)  predniSONE (DELTASONE) tablet 40 mg (40 mg Oral Given 02/20/22 1304)    Initial Impression / Assessment and Plan / UC Course  I have reviewed the triage vital signs and the nursing notes.  Pertinent labs & imaging results that were available during my care of the patient were reviewed by me and considered in my medical decision making (see chart for details).   1. Allergic  reaction Presentation is consistent with allergic reaction to dust related to recent cleaning and exposure to dust particles in the garage.  Significant maxillary sinus tenderness and nasal congestion.  Turbinates are patent, although swollen.  No indication for further antibiotic therapy today as symptoms have only been present for 3 days.  Patient to continue use of guaifenesin to help break up mucus that he is able to blow it out of his nose easier.  Advise increase water intake as well for guaifenesin to work well in his body.   Patient given albuterol breathing treatment to help with wheezing and shortness of breath.  Reports significant subjective improvement in shortness of breath after breathing treatment and lung sounds improved as well, wheezing resolved.  Steroid burst 40 mg once daily for the next 5 days sent to pharmacy.  First dose of prednisone 40 mg given in clinic.  Deferred imaging based on stable cardiopulmonary exam and hemodynamically stable vital signs.  Imaging in clinic at last visit on November 2 was negative for acute cardiopulmonary abnormality.  He is to continue using albuterol inhaler as needed for wheeze and shortness of breath.  May continue taking guaifenesin and may also take cetirizine once daily to help with allergic rhinitis symptoms.  Referral to allergy specialist provided as this is the second time patient has had the symptoms in the last month triggered by dust particles. Strict ER return precautions discussed. He is agreeable with this plan.  Discussed physical exam and available lab work findings in clinic with patient.  Counseled patient  regarding appropriate use of medications and potential side effects for all medications recommended or prescribed today. Discussed red flag signs and symptoms of worsening condition,when to call the PCP office, return to urgent care, and when to seek higher level of care in the emergency department. Patient verbalizes understanding  and agreement with plan. All questions answered. Patient discharged in stable condition.     Final Clinical Impressions(s) / UC Diagnoses   Final diagnoses:  Shortness of breath  Wheeze  Nasal congestion  Allergic reaction, initial encounter     Discharge Instructions      Continue using albuterol inhaler as needed for wheeze and shortness of breath.  Take prednisone steroid burst 40 mg over the next 4 days with breakfast.  We gave your first dose of prednisone in the clinic today's your next dose will be tomorrow morning with breakfast. Do not take any ibuprofen while taking prednisone as it can cause stomach upset.  Continue taking guaifenesin to help thin mucus so that you are able to blow it out of your nose and cough it up easier.  Take cetirizine once daily to help with allergies.  I have placed a referral to allergy and asthma for further evaluation of your symptoms.     ED Prescriptions     Medication Sig Dispense Auth. Provider   predniSONE (DELTASONE) 20 MG tablet Take 2 tablets (40 mg total) by mouth daily for 4 days. 8 tablet Carlisle Beers, FNP   cetirizine (ZYRTEC) 10 MG tablet Take 1 tablet (10 mg total) by mouth daily. 30 tablet Carlisle Beers, FNP      PDMP not reviewed this encounter.   Carlisle Beers, [place] 02/20/22 2043

## 2022-02-20 NOTE — Discharge Instructions (Addendum)
Continue using albuterol inhaler as needed for wheeze and shortness of breath.  Take prednisone steroid burst 40 mg over the next 4 days with breakfast.  We gave your first dose of prednisone in the clinic today's your next dose will be tomorrow morning with breakfast. Do not take any ibuprofen while taking prednisone as it can cause stomach upset.  Continue taking guaifenesin to help thin mucus so that you are able to blow it out of your nose and cough it up easier.  Take cetirizine once daily to help with allergies.  I have placed a referral to allergy and asthma for further evaluation of your symptoms.

## 2022-02-20 NOTE — ED Triage Notes (Signed)
Chief Complaint: Sinus symptoms were treated with antibiotics. Went back out to finish cleaning his garage and symptoms returned. Negative home test.   Onset: Beginning of November. Was seen here for these symptoms.   OTC medications tried: Yes- Guaifenesin; albuterol nebulizer    with mild relief  Sick exposure: No  New foods or medications: No  Recent Travel: Yes- [place] 2 weeks ago

## 2022-02-27 ENCOUNTER — Encounter: Payer: Self-pay | Admitting: Certified Registered Nurse Anesthetist

## 2022-03-03 ENCOUNTER — Encounter: Payer: Self-pay | Admitting: Internal Medicine

## 2022-03-03 ENCOUNTER — Ambulatory Visit (AMBULATORY_SURGERY_CENTER): Payer: No Typology Code available for payment source | Admitting: Internal Medicine

## 2022-03-03 VITALS — BP 120/86 | HR 82 | Temp 97.8°F | Resp 11 | Ht 72.0 in | Wt 263.0 lb

## 2022-03-03 DIAGNOSIS — K2270 Barrett's esophagus without dysplasia: Secondary | ICD-10-CM | POA: Diagnosis not present

## 2022-03-03 DIAGNOSIS — K219 Gastro-esophageal reflux disease without esophagitis: Secondary | ICD-10-CM | POA: Diagnosis present

## 2022-03-03 DIAGNOSIS — Z1211 Encounter for screening for malignant neoplasm of colon: Secondary | ICD-10-CM

## 2022-03-03 DIAGNOSIS — K2950 Unspecified chronic gastritis without bleeding: Secondary | ICD-10-CM | POA: Diagnosis not present

## 2022-03-03 DIAGNOSIS — K2970 Gastritis, unspecified, without bleeding: Secondary | ICD-10-CM

## 2022-03-03 MED ORDER — SODIUM CHLORIDE 0.9 % IV SOLN: 500.0000 mL | Freq: Once | INTRAVENOUS | Status: DC

## 2022-03-03 MED ORDER — PANTOPRAZOLE SODIUM 40 MG PO TBEC: 40.0000 mg | DELAYED_RELEASE_TABLET | Freq: Two times a day (BID) | ORAL | 3 refills | Status: DC

## 2022-03-03 NOTE — Progress Notes (Signed)
Report given to PACU, vss 

## 2022-03-03 NOTE — Progress Notes (Signed)
Called to room to assist during endoscopic procedure.  Patient ID and intended procedure confirmed with present staff. Received instructions for my participation in the procedure from the performing physician.  

## 2022-03-03 NOTE — Op Note (Signed)
[facility] Patient Name: Antonio Ward Procedure Date: 03/03/2022 2:28 PM MRN: [id number] Endoscopist: Beverley Fiedler , MD, [id number] Age: 46 Referring MD:  Date of Birth: 16-Nov-1975 Gender: Male Account #: [id number] Procedure:                Colonoscopy Indications:              Screening for colorectal malignant neoplasm, This                            is the patient's first colonoscopy Medicines:                Monitored Anesthesia Care Procedure:                Pre-Anesthesia Assessment:                           - Prior to the procedure, a History and Physical                            was performed, and patient medications and                            allergies were reviewed. The patient's tolerance of                            previous anesthesia was also reviewed. The risks                            and benefits of the procedure and the sedation                            options and risks were discussed with the patient.                            All questions were answered, and informed consent                            was obtained. Prior Anticoagulants: The patient has                            taken no anticoagulant or antiplatelet agents. ASA                            Grade Assessment: II - A patient with mild systemic                            disease. After reviewing the risks and benefits,                            the patient was deemed in satisfactory condition to                            undergo the procedure.  After obtaining informed consent, the colonoscope                            was passed under direct vision. Throughout the                            procedure, the patient's blood pressure, pulse, and                            oxygen saturations were monitored continuously. The                            Olympus CF-HQ190L [id number]) Colonoscope was                            introduced through the anus and  advanced to the                            cecum, identified by appendiceal orifice and                            ileocecal valve. The colonoscopy was performed                            without difficulty. The patient tolerated the                            procedure well. The quality of the bowel                            preparation was good. The ileocecal valve,                            appendiceal orifice, and rectum were photographed. Scope In: 3:07:13 PM Scope Out: 3:16:33 PM Scope Withdrawal Time: 0 hours 8 minutes 9 seconds  Total Procedure Duration: 0 hours 9 minutes 20 seconds  Findings:                 The digital rectal exam was normal.                           Multiple small-mouthed diverticula were found in                            the sigmoid colon.                           Internal hemorrhoids were found during                            retroflexion. The hemorrhoids were medium-sized.                           The exam was otherwise without abnormality. Complications:            No immediate complications. Estimated Blood Loss:  Estimated blood loss: none. Impression:               - Mild diverticulosis in the sigmoid colon.                           - Internal hemorrhoids.                           - The examination was otherwise normal.                           - No specimens collected. Recommendation:           - Patient has a contact number available for                            emergencies. The signs and symptoms of potential                            delayed complications were discussed with the                            patient. Return to normal activities tomorrow.                            Written discharge instructions were provided to the                            patient.                           - Resume previous diet.                           - Continue present medications.                           - Repeat colonoscopy in 10 years  for screening                            purposes. Beverley Fiedler, MD 03/03/2022 3:27:35 PM This report has been signed electronically.

## 2022-03-03 NOTE — Progress Notes (Signed)
VS completed by DT.  Pt's states no medical or surgical changes since previsit or office visit.  

## 2022-03-03 NOTE — Patient Instructions (Addendum)
Handouts provided on Barrett's esophagus, gastritis, diverticulosis and hemorrhoids.   Resume previous diet.  Continue present medications. Start Pantoprazole 40mg  once daily. If breakthrough evening or nocturnal heartburn then add Famotidine 20mg  at bedtime as needed. Stop taking Omeprazole.  Await pathology results.  Repeat upper endoscopy for surveillance based on pathology results.  Repeat colonoscopy in 10 years for screening purposes.   YOU HAD AN ENDOSCOPIC PROCEDURE TODAY AT THE [facility]:   Refer to the procedure report that was given to you for any specific questions about what was found during the examination.  If the procedure report does not answer your questions, please call your gastroenterologist to clarify.  If you requested that your care partner not be given the details of your procedure findings, then the procedure report has been included in a sealed envelope for you to review at your convenience later.  YOU SHOULD EXPECT: Some feelings of bloating in the abdomen. Passage of more gas than usual.  Walking can help get rid of the air that was put into your GI tract during the procedure and reduce the bloating. If you had a lower endoscopy (such as a colonoscopy or flexible sigmoidoscopy) you may notice spotting of blood in your stool or on the toilet paper. If you underwent a bowel prep for your procedure, you may not have a normal bowel movement for a few days.  Please Note:  You might notice some irritation and congestion in your nose or some drainage.  This is from the oxygen used during your procedure.  There is no need for concern and it should clear up in a day or so.  SYMPTOMS TO REPORT IMMEDIATELY:  Following lower endoscopy (colonoscopy or flexible sigmoidoscopy):  Excessive amounts of blood in the stool  Significant tenderness or worsening of abdominal pains  Swelling of the abdomen that is new, acute  Fever of 100F or higher  Following upper  endoscopy (EGD)  Vomiting of blood or coffee ground material  New chest pain or pain under the shoulder blades  Painful or persistently difficult swallowing  New shortness of breath  Fever of 100F or higher  Black, tarry-looking stools  For urgent or emergent issues, a gastroenterologist can be reached at any hour by calling [phone]. Do not use MyChart messaging for urgent concerns.    DIET:  We do recommend a small meal at first, but then you may proceed to your regular diet.  Drink plenty of fluids but you should avoid alcoholic beverages for 24 hours.  ACTIVITY:  You should plan to take it easy for the rest of today and you should NOT DRIVE or use heavy machinery until tomorrow (because of the sedation medicines used during the test).    FOLLOW UP: Our staff will call the number listed on your records the next business day following your procedure.  We will call around 7:15- 8:00 am to check on you and address any questions or concerns that you may have regarding the information given to you following your procedure. If we do not reach you, we will leave a message.     If any biopsies were taken you will be contacted by phone or by letter within the next 1-3 weeks.  Please call at [phone] if you have not heard about the biopsies in 3 weeks.    SIGNATURES/CONFIDENTIALITY: You and/or your care partner have signed paperwork which will be entered into your electronic medical record.  These signatures attest to  the fact that that the information above on your After Visit Summary has been reviewed and is understood.  Full responsibility of the confidentiality of this discharge information lies with you and/or your care-partner.

## 2022-03-03 NOTE — Progress Notes (Signed)
GASTROENTEROLOGY PROCEDURE H&P NOTE   Primary Care Physician: Kristian Covey, MD    Reason for Procedure:  GERD and colon cancer screening  Plan:    EGD and colonoscopy  Patient is appropriate for endoscopic procedure(s) in the ambulatory ([facility]) setting.  The nature of the procedure, as well as the risks, benefits, and alternatives were carefully and thoroughly reviewed with the patient. Ample time for discussion and questions allowed. The patient understood, was satisfied, and agreed to proceed.     HPI: Antonio Ward is a 46 y.o. male who presents for EGD and colonoscopy.  Medical history as below.  Tolerated the prep.  No recent chest pain or shortness of breath.  No abdominal pain today.  Past Medical History:  Diagnosis Date   GERD (gastroesophageal reflux disease)    Headache     Past Surgical History:  Procedure Laterality Date   right leg severely broken Right 1999   titanium rod, plate and 8 screws   TONSILLECTOMY  1980   WISDOM TOOTH EXTRACTION  1998    Prior to Admission medications   Medication Sig Start Date End Date Taking? Authorizing Provider  omeprazole (PRILOSEC OTC) 20 MG tablet Take 1 tablet (20 mg total) by mouth daily. Patient taking differently: Take 40 mg by mouth daily. 01/11/18  Yes Unk Lightning, PA  albuterol (VENTOLIN HFA) 108 (90 Base) MCG/ACT inhaler Inhale 1-2 puffs into the lungs every 6 (six) hours as needed for wheezing or shortness of breath. 01/27/22   Carlisle Beers, FNP  benzonatate (TESSALON) 100 MG capsule Take 1 capsule (100 mg total) by mouth every 8 (eight) hours. 01/27/22   Carlisle Beers, FNP  cetirizine (ZYRTEC) 10 MG tablet Take 1 tablet (10 mg total) by mouth daily. 02/20/22   Carlisle Beers, FNP  Guaifenesin 1200 MG TB12 Take 1 tablet (1,200 mg total) by mouth in the morning and at bedtime. 01/27/22   Carlisle Beers, FNP    Current Outpatient Medications  Medication Sig Dispense  Refill   omeprazole (PRILOSEC OTC) 20 MG tablet Take 1 tablet (20 mg total) by mouth daily. (Patient taking differently: Take 40 mg by mouth daily.) 30 tablet 3   albuterol (VENTOLIN HFA) 108 (90 Base) MCG/ACT inhaler Inhale 1-2 puffs into the lungs every 6 (six) hours as needed for wheezing or shortness of breath. 8 g 0   benzonatate (TESSALON) 100 MG capsule Take 1 capsule (100 mg total) by mouth every 8 (eight) hours. 21 capsule 0   cetirizine (ZYRTEC) 10 MG tablet Take 1 tablet (10 mg total) by mouth daily. 30 tablet 2   Guaifenesin 1200 MG TB12 Take 1 tablet (1,200 mg total) by mouth in the morning and at bedtime. 14 tablet 0   Current Facility-Administered Medications  Medication Dose Route Frequency Provider Last Rate Last Admin   0.9 %  sodium chloride infusion  500 mL Intravenous Once Israella Hubert, Carie Caddy, MD        Allergies as of 03/03/2022 - Review Complete 03/03/2022  Allergen Reaction Noted   Penicillins  05/19/2020    Family History  Problem Relation Age of Onset   Alcohol abuse Mother    Depression Mother    Heart disease Father    Hypertension Father    Cervical cancer Maternal Grandmother    Cancer Paternal Grandmother        cervical ??   Colon cancer Neg Hx    Esophageal cancer Neg Hx  Social History   Socioeconomic History   Marital status: Married    Spouse name: Not on file   Number of children: 4   Years of education: Not on file   Highest education level: Associate degree: academic program  Occupational History   Not on file  Tobacco Use   Smoking status: Former    Packs/day: 0.50    Years: 20.00    Total pack years: 10.00    Types: Cigarettes    Quit date: 01/26/2003    Years since quitting: 19.1   Smokeless tobacco: Never  Vaping Use   Vaping Use: Never used  Substance and Sexual Activity   Alcohol use: No   Drug use: No   Sexual activity: Yes  Other Topics Concern   Not on file  Social History Narrative   Lives with wife   caffeine-  occas soda   Social Determinants of Health   Financial Resource Strain: Not on file  Food Insecurity: Not on file  Transportation Needs: Not on file  Physical Activity: Not on file  Stress: Not on file  Social Connections: Not on file  Intimate Partner Violence: Not on file    Physical Exam: Vital signs in last 24 hours: @BP  126/73   Pulse 87   Temp 97.8 F (36.6 C) (Temporal)   Ht 6' (1.829 m)   Wt 263 lb (119.3 kg)   SpO2 97%   BMI 35.67 kg/m  GEN: NAD EYE: Sclerae anicteric ENT: MMM CV: Non-tachycardic Pulm: CTA b/l GI: Soft, NT/ND NEURO:  Alert & Oriented x 3   , MD [facility]  03/03/2022 2:30 PM

## 2022-03-03 NOTE — Op Note (Signed)
[facility] Patient Name: Antonio Ward Procedure Date: 03/03/2022 2:40 PM MRN: [id number] Endoscopist: Beverley Fiedler , MD, [id number] Age: 46 Referring MD:  Date of Birth: 1975-07-16 Gender: Male Account #: [id number] Procedure:                Upper GI endoscopy Indications:              Gastro-esophageal reflux disease, intermittent                            upper abd bloating/belching, symptoms improved on                            BID omeprazole 20 mg (increased from once daily 3-4                            months ago) Medicines:                Monitored Anesthesia Care Procedure:                Pre-Anesthesia Assessment:                           - Prior to the procedure, a History and Physical                            was performed, and patient medications and                            allergies were reviewed. The patient's tolerance of                            previous anesthesia was also reviewed. The risks                            and benefits of the procedure and the sedation                            options and risks were discussed with the patient.                            All questions were answered, and informed consent                            was obtained. Prior Anticoagulants: The patient has                            taken no anticoagulant or antiplatelet agents. ASA                            Grade Assessment: II - A patient with mild systemic                            disease. After reviewing the risks and benefits,  the patient was deemed in satisfactory condition to                            undergo the procedure.                           After obtaining informed consent, the endoscope was                            passed under direct vision. Throughout the                            procedure, the patient's blood pressure, pulse, and                            oxygen saturations were monitored continuously.  The                            GIF [id number] #[id number] was introduced through the                            mouth, and advanced to the second part of duodenum.                            The upper GI endoscopy was accomplished without                            difficulty. The patient tolerated the procedure                            well. Scope In: Scope Out: Findings:                 The esophagus and gastroesophageal junction were                            examined with white light and narrow band imaging                            (NBI) from a forward view and retroflexed position.                            There were esophageal mucosal changes consistent                            with long-segment Barrett's esophagus. These                            changes involved the mucosa at the upper extent of                            the gastric folds (40 cm from the incisors)                            extending to the  Z-line (34 cm from the incisors).                            Circumferential salmon-colored mucosa was present                            from 34 to 40 cm. The maximum longitudinal extent                            of these esophageal mucosal changes was 6 cm in                            length. Mucosa was biopsied with a cold forceps for                            histology in 4 quadrants at intervals of 2 cm from                            34 to 40 cm from the incisors. A total of 4                            specimen bottles were sent to pathology.                           Diffuse moderately erythematous mucosa without                            bleeding was found in the gastric fundus and in the                            gastric body. Biopsies were taken with a cold                            forceps for histology and Helicobacter pylori                            testing.                           The examined duodenum was normal. Complications:            No  immediate complications. Estimated Blood Loss:     Estimated blood loss was minimal. Impression:               - Esophageal mucosal changes consistent with                            long-segment Barrett's esophagus. Biopsied.                           - Erythematous mucosa in the gastric fundus and                            gastric body. Biopsied.                           -  Normal examined duodenum. Recommendation:           - Patient has a contact number available for                            emergencies. The signs and symptoms of potential                            delayed complications were discussed with the                            patient. Return to normal activities tomorrow.                            Written discharge instructions were provided to the                            patient.                           - Resume previous diet.                           - Continue present medications. If persistent GERD                            symptoms could change PPI to pantoprazole 40 mg                            once daily. If breakthrough evening or nocturnal                            heartburn then add famotidine 20 mg qHS PRN.                           - Await pathology results.                           - Repeat upper endoscopy for surveillance based on                            pathology results. Beverley FiedlerJay M Tylisa Alcivar, MD 03/03/2022 3:26:13 PM This report has been signed electronically.

## 2022-03-04 ENCOUNTER — Telehealth: Payer: Self-pay

## 2022-03-04 NOTE — Telephone Encounter (Signed)
Left message on follow up call. 

## 2022-03-09 ENCOUNTER — Encounter: Payer: Self-pay | Admitting: Internal Medicine

## 2022-03-09 DIAGNOSIS — K2270 Barrett's esophagus without dysplasia: Secondary | ICD-10-CM | POA: Insufficient documentation

## 2022-03-29 ENCOUNTER — Telehealth: Payer: Self-pay | Admitting: Internal Medicine

## 2022-03-29 NOTE — Telephone Encounter (Signed)
Inbound call from patient stating protonix medication is not working as well as he feels it should. Please advise

## 2022-03-29 NOTE — Telephone Encounter (Signed)
Pt states he had an ECL with Dr. Hilarie Fredrickson early December. He was taking omeprazole and Dr. Hilarie Fredrickson changed him to protonix 20mg  in the am. He was to take it for 2 weeks and let us know how he is doing. Pt states he takes in in he am but later in the evening he is having some reflux symptoms. He wants to know if Dr. Norman Herrlich wants to increase the dose or have him take it BID. Please advise. He knows that Dr. Norman Herrlich is out of the office and will reply upon his return.

## 2022-03-30 NOTE — Telephone Encounter (Signed)
Would increase to pantoprazole 40 mg BID-AC He can then let me know if not effective at that dose Thanks JMP

## 2022-03-30 NOTE — Telephone Encounter (Signed)
Please confirm the dose because pantoprazole 40 mg was initially recommended twice daily AC for 2 weeks and then 40 mg twice daily.  Famotidine 20 mg was recommended in the evening if needed for breakthrough symptoms once pantoprazole was backed down to once per day Please see if panto is 20 mg or 40 mg Thanks JMP 3

## 2022-03-30 NOTE — Telephone Encounter (Signed)
Spoke with pt and he is aware of Dr. Pyrlte's recommendations. 

## 2022-03-30 NOTE — Telephone Encounter (Signed)
Pt states he probably does have the 40mg  dose. States however after he was waking up after the procedure that he and his wife were told for him to take 1 in the am to see how that worked for him. He was instructed to call back and let Dr. Hilarie Fredrickson know if this was not working and he would adjust. Please advise.

## 2022-04-07 ENCOUNTER — Encounter: Payer: Self-pay | Admitting: Allergy & Immunology

## 2022-04-07 ENCOUNTER — Other Ambulatory Visit: Payer: Self-pay

## 2022-04-07 ENCOUNTER — Ambulatory Visit: Payer: Commercial Managed Care - PPO | Admitting: Allergy & Immunology

## 2022-04-07 VITALS — BP 130/70 | HR 87 | Temp 98.5°F | Resp 20 | Wt 259.0 lb

## 2022-04-07 DIAGNOSIS — K219 Gastro-esophageal reflux disease without esophagitis: Secondary | ICD-10-CM | POA: Diagnosis not present

## 2022-04-07 DIAGNOSIS — R0981 Nasal congestion: Secondary | ICD-10-CM

## 2022-04-07 DIAGNOSIS — T7840XD Allergy, unspecified, subsequent encounter: Secondary | ICD-10-CM

## 2022-04-07 DIAGNOSIS — J3089 Other allergic rhinitis: Secondary | ICD-10-CM | POA: Diagnosis not present

## 2022-04-07 DIAGNOSIS — J302 Other seasonal allergic rhinitis: Secondary | ICD-10-CM

## 2022-04-07 NOTE — Progress Notes (Signed)
NEW PATIENT  Date of Service/Encounter:  04/07/22  Consult requested by: Antonio Covey, MD   Assessment:   Seasonal and perennial allergic rhinitis (grasses, ragweed, weeds, trees, and dust mites)  Nasal congestion  Allergic reaction - unknown trigger (consider tryptase in the future, but he did not want blood work today)  GERD - currently on Protonix    I am not convinced that the testing we did really answers the question, although I suppose he could have had a severe allergic reaction to ragweed pollen at the time of that happening.  He was positive to dust mites, but dust mites usually running out in places like the garage.  I did want to do some additional intradermal testing and obtain some labs, but he was very averse to needles.  Will see how he is when this happens once again.  He does have cetirizine that he is going to add twice a day.  I also recommended that he add Flonase as well.  Plan/Recommendations:   1. Seasonal and perennial allergic rhinitis - with allergic reactions - Testing today showed: grasses, ragweed, weeds, trees, and dust mites. - Copy of test results provided.  - I would venture to guess that your reaction was likely related to ragweed pollen exposure. - Dust mites are an option, but these are typically found in places with carpeting, bedding and fabric (like curtains).  - I would like to see you if this happens again!  - We typically have open sick visits, so just give [place] a call.  - Avoidance measures provided. - Start taking: Zyrtec (cetirizine) 10mg  tablet TWICE DAILY and Flonase (fluticasone) one spray per nostril TWICE DAILY AT THE FIRST SIGN OF A REACTION - You can use an extra dose of the antihistamine, if needed, for breakthrough symptoms.  - Consider nasal saline rinses 1-2 times daily to remove allergens from the nasal cavities as well as help with mucous clearance (this is especially helpful to do before the nasal sprays are given) -  I did not do food testing since you were eating all of the major food allergens without adverse reactions.   2. Return in about 6 months (around 10/06/2022).    This note in its entirety was forwarded to the Provider who requested this consultation.  Subjective:   Antonio Ward is a 47 y.o. male presenting today for evaluation of  Chief Complaint  Patient presents with   Allergy Testing    ALLERGY TESTING.    50 has a history of the following: Patient Active Problem List   Diagnosis Date Noted   Barrett's esophagus 03/09/2022   Headache syndrome 09/04/2020   Iliotibial band syndrome of left side 12/19/2016   Obesity (BMI 30-39.9) 12/20/2013   GERD (gastroesophageal reflux disease) 06/25/2012    History obtained from: chart review and patient.  06/27/2012 was referred by Antonio Flake, MD.     Antonio Ward is a 47 y.o. male presenting for an evaluation of an allergic reaction .  He was in his garage in November. It was dusty. He started having marked nasal congestion. It was starting to "move into [his] chest". He got a nebulizer treatment from his wife. He had a CXR that was negative for pneumonia. He got some prednisone. In later November 26th, he went out there and put a mask on to finish the garage. He had the same thing and he landed in the Urgent Care. He only got prednisone that  time and he was cleared up shortly thereafter.   He was born in [place], [place]. He moved here with his parents in the 47s when he was 66 or 6. His father was a smoker. Father passed away from CHF and passed in his sleep. His mother is still with [place]. He has four children and his youngest has a lot of allergies. His wife eats chocolate and sneezes. His youngest had an allergy to egg. He only has a peanut allergy at that point.   Allergic Rhinitis Symptom History: He has a known allergy to pet dander. He does not use any allergy medications.  He does react to his dog when he is around him  for a while. But for the most part, he does fine without any problems at all. He has never been tested in the past.   Otherwise, there is no history of other atopic diseases, including drug allergies, stinging insect allergies, urticaria, or contact dermatitis. There is no significant infectious history. Vaccinations are up to date.    Past Medical History: Patient Active Problem List   Diagnosis Date Noted   Barrett's esophagus 03/09/2022   Headache syndrome 09/04/2020   Iliotibial band syndrome of left side 12/19/2016   Obesity (BMI 30-39.9) 12/20/2013   GERD (gastroesophageal reflux disease) 06/25/2012    Medication List:  Allergies as of 04/07/2022       Reactions   Penicillins    Per patient unknown action noted in childhood        Medication List        Accurate as of April 07, 2022  2:58 PM. If you have any questions, ask your nurse or doctor.          albuterol 108 (90 Base) MCG/ACT inhaler Commonly known as: VENTOLIN HFA Inhale 1-2 puffs into the lungs every 6 (six) hours as needed for wheezing or shortness of breath.   benzonatate 100 MG capsule Commonly known as: TESSALON Take 1 capsule (100 mg total) by mouth every 8 (eight) hours.   cetirizine 10 MG tablet Commonly known as: ZYRTEC Take 1 tablet (10 mg total) by mouth daily.   Guaifenesin 1200 MG Tb12 Take 1 tablet (1,200 mg total) by mouth in the morning and at bedtime.   pantoprazole 40 MG tablet Commonly known as: PROTONIX Take 1 tablet (40 mg total) by mouth 2 (two) times daily before a meal.        Birth History: non-contributory  Developmental History: non-contributory  Past Surgical History: Past Surgical History:  Procedure Laterality Date   right leg severely broken Right 1999   titanium rod, plate and 8 screws   TONSILLECTOMY  1980   WISDOM TOOTH EXTRACTION  1998     Family History: Family History  Problem Relation Age of Onset   Alcohol abuse Mother    Depression  Mother    Heart disease Father    Hypertension Father    Cervical cancer Maternal Grandmother    Cancer Paternal Grandmother        cervical ??   Colon cancer Neg Hx    Esophageal cancer Neg Hx      Social History: Antonio Ward lives at home with his family.  He currently works as a Geneticist, molecular.  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  His wife works as a Equities trader with the [employer].   Review of Systems  Constitutional: Negative.  Negative for chills, fever, malaise/fatigue and weight loss.  HENT: Negative.  Negative for congestion, ear discharge and ear pain.   Eyes:  Negative for pain, discharge and redness.  Respiratory:  Negative for cough, sputum production, shortness of breath and wheezing.   Cardiovascular: Negative.  Negative for chest pain and palpitations.  Gastrointestinal:  Negative for abdominal pain, diarrhea, heartburn, nausea and vomiting.  Skin: Negative.  Negative for itching and rash.  Neurological:  Negative for dizziness and headaches.  Endo/Heme/Allergies:  Positive for environmental allergies. Does not bruise/bleed easily.       Objective:   Blood pressure 130/70, pulse 87, temperature 98.5 F (36.9 C), resp. rate 20, weight 259 lb (117.5 kg), SpO2 97 %. Body mass index is 35.13 kg/m.     Physical Exam Vitals reviewed.  Constitutional:      Appearance: He is well-developed.     Comments: Very pleasant.  Talkative.  HENT:     Head: Normocephalic and atraumatic.     Right Ear: Tympanic membrane, ear canal and external ear normal. No drainage, swelling or tenderness. Tympanic membrane is not injected, scarred, erythematous, retracted or bulging.     Left Ear: Tympanic membrane, ear canal and external ear normal. No drainage, swelling or tenderness. Tympanic membrane is not injected, scarred, erythematous, retracted or bulging.     Nose: No nasal deformity, septal deviation, mucosal edema or rhinorrhea.     Right Sinus: No  maxillary sinus tenderness or frontal sinus tenderness.     Left Sinus: No maxillary sinus tenderness or frontal sinus tenderness.     Comments: Copious clear rhinorrhea.  No nasal polyps.    Mouth/Throat:     Mouth: Mucous membranes are not pale and not dry.     Pharynx: Uvula midline.  Eyes:     General:        Right eye: No discharge.        Left eye: No discharge.     Conjunctiva/sclera: Conjunctivae normal.     Right eye: Right conjunctiva is not injected. No chemosis.    Left eye: Left conjunctiva is not injected. No chemosis.    Pupils: Pupils are equal, round, and reactive to light.  Cardiovascular:     Rate and Rhythm: Normal rate and regular rhythm.     Heart sounds: Normal heart sounds.  Pulmonary:     Effort: Pulmonary effort is normal. No tachypnea, accessory muscle usage or respiratory distress.     Breath sounds: Normal breath sounds. No wheezing, rhonchi or rales.  Chest:     Chest wall: No tenderness.  Abdominal:     Tenderness: There is no abdominal tenderness. There is no guarding or rebound.  Lymphadenopathy:     Head:     Right side of head: No submandibular, tonsillar or occipital adenopathy.     Left side of head: No submandibular, tonsillar or occipital adenopathy.     Cervical: No cervical adenopathy.  Skin:    Coloration: Skin is not pale.     Findings: No abrasion, erythema, petechiae or rash. Rash is not papular, urticarial or vesicular.  Neurological:     Mental Status: He is alert.  Psychiatric:        Behavior: Behavior is cooperative.      Diagnostic studies:    Allergy Studies:     Airborne Adult Perc - 04/07/22 1352     Time Antigen Placed 1352    Allergen Manufacturer Waynette Buttery    Location Back    Number of Test 59    Panel 1 Select  1. Control-Buffer 50% Glycerol Negative    2. Control-Histamine 1 mg/ml 2+    3. Albumin saline Negative    4. [place] Negative    5. [place] Negative    6. Johnson Negative    7. [place] Blue  Negative    8. Meadow Fescue Negative    9. Perennial Rye Negative    10. Sweet Vernal Negative    11. Timothy Negative    12. Cocklebur Negative    13. Burweed Marshelder Negative    14. Ragweed, short 3+    15. Ragweed, Giant 3+    16. Plantain,  English Negative    17. Lamb's Quarters Negative    18. Sheep Sorrell 2+    19. Rough Pigweed 2+    20. Marsh Elder, Rough Negative    21. Mugwort, Common Negative    22. Ash mix 3+    23. Birch mix Negative    24. Beech American Negative    25. Box, Elder Negative    26. Cedar, red Negative    27. Cottonwood, [place] Negative    28. Elm mix Negative    29. Hickory Negative    30. Maple mix 2+    31. Oak, [place] mix 2+    32. Pecan Pollen Negative    33. Pine mix 2+    34. Sycamore Eastern Negative    35. Lester, Black Pollen Negative    36. Alternaria alternata Negative    37. Cladosporium Herbarum Negative    38. Aspergillus mix Negative    39. Penicillium mix Negative    40. Bipolaris sorokiniana (Helminthosporium) Negative    41. Drechslera spicifera (Curvularia) Negative    42. Mucor plumbeus Negative    43. Fusarium moniliforme Negative    44. Aureobasidium pullulans (pullulara) Negative    45. Rhizopus oryzae Negative    46. Botrytis cinera Negative    47. Epicoccum nigrum Negative    48. Phoma betae Negative    49. Candida Albicans Negative    50. Trichophyton mentagrophytes Negative    51. Mite, D Farinae  5,000 AU/ml Negative    52. Mite, D Pteronyssinus  5,000 AU/ml 3+    53. Cat Hair 10,000 BAU/ml Negative    54.  Dog Epithelia Negative    55. Mixed Feathers Negative    56. Horse Epithelia Negative    57. Cockroach, German Negative    58. Mouse Negative    59. Tobacco Leaf Negative                Allergy testing results were read and interpreted by myself, documented by clinical staff.         Salvatore Marvel, MD [facility]

## 2022-04-07 NOTE — Patient Instructions (Addendum)
1. Seasonal and perennial allergic rhinitis - with allergic reactions - Testing today showed: grasses, ragweed, weeds, trees, and dust mites. - Copy of test results provided.  - I would venture to guess that your reaction was likely related to ragweed pollen exposure. - Dust mites are an option, but these are typically found in places with carpeting, bedding and fabric (like curtains).  - I would like to see you if this happens again!  - We typically have open sick visits, so just give [place] a call.  - Avoidance measures provided. - Start taking: Zyrtec (cetirizine) 10mg  tablet TWICE DAILY and Flonase (fluticasone) one spray per nostril TWICE DAILY AT THE FIRST SIGN OF A REACTION - You can use an extra dose of the antihistamine, if needed, for breakthrough symptoms.  - Consider nasal saline rinses 1-2 times daily to remove allergens from the nasal cavities as well as help with mucous clearance (this is especially helpful to do before the nasal sprays are given) - I did not do food testing since you were eating all of the major food allergens without adverse reactions.   2. Return in about 6 months (around 10/06/2022).    Please inform 12/07/2022 of any Emergency Department visits, hospitalizations, or changes in symptoms. Call us before going to the ED for breathing or allergy symptoms since we might be able to fit you in for a sick visit. Feel free to contact us anytime with any questions, problems, or concerns.  It was a pleasure to meet you today!  Websites that have reliable patient information: 1. American Academy of Asthma, Allergy, and Immunology: www.aaaai.org 2. Food Allergy Research and Education (FARE): foodallergy.org 3. Mothers of Asthmatics: http://www.asthmacommunitynetwork.org 4. American College of Allergy, Asthma, and Immunology: www.acaai.org   COVID-19 Vaccine Information can be found at: [place] For questions  related to vaccine distribution or appointments, please email [email or web address] or call [phone].   We realize that you might be concerned about having an allergic reaction to the COVID19 vaccines. To help with that concern, WE ARE OFFERING THE COVID19 VACCINES IN OUR OFFICE! Ask the front desk for dates!     "Like" [phone] on Facebook and Instagram for our latest updates!      A healthy democracy works best when [place] participate! Make sure you are registered to vote! If you have moved or changed any of your contact information, you will need to get this updated before voting!  In some cases, you MAY be able to register to vote online: [employer]     Airborne Adult Perc - 04/07/22 1352     Time Antigen Placed 1352    Allergen Manufacturer 06/06/22    Location Back    Number of Test 59    Panel 1 Select    1. Control-Buffer 50% Glycerol Negative    2. Control-Histamine 1 mg/ml 2+    3. Albumin saline Negative    4. Bahia Negative    5. Waynette Buttery Negative    6. Johnson Negative    7. [place] Blue Negative    8. Meadow Fescue Negative    9. Perennial Rye Negative    10. Sweet Vernal Negative    11. Timothy Negative    12. Cocklebur Negative    13. Burweed Marshelder Negative    14. Ragweed, short 3+    15. Ragweed, Giant 3+    16. Plantain,  English Negative    17. Lamb's Quarters Negative    18. Sheep Sorrell 2+  19. Rough Pigweed 2+    20. Marsh Elder, Rough Negative    21. Mugwort, Common Negative    22. Ash mix 3+    23. Birch mix Negative    24. Beech American Negative    25. Box, Elder Negative    26. Cedar, red Negative    27. Cottonwood, [place] Negative    28. Elm mix Negative    29. Hickory Negative    30. Maple mix 2+    31. Oak, [place] mix 2+    32. Pecan Pollen Negative    33. Pine mix 2+    34. Sycamore Eastern Negative    35. [place], Black Pollen Negative    36. Alternaria alternata Negative    37.  Cladosporium Herbarum Negative    38. Aspergillus mix Negative    39. Penicillium mix Negative    40. Bipolaris sorokiniana (Helminthosporium) Negative    41. Drechslera spicifera (Curvularia) Negative    42. Mucor plumbeus Negative    43. Fusarium moniliforme Negative    44. Aureobasidium pullulans (pullulara) Negative    45. Rhizopus oryzae Negative    46. Botrytis cinera Negative    47. Epicoccum nigrum Negative    48. Phoma betae Negative    49. Candida Albicans Negative    50. Trichophyton mentagrophytes Negative    51. Mite, D Farinae  5,000 AU/ml Negative    52. Mite, D Pteronyssinus  5,000 AU/ml 3+    53. Cat Hair 10,000 BAU/ml Negative    54.  Dog Epithelia Negative    55. Mixed Feathers Negative    56. Horse Epithelia Negative    57. Cockroach, German Negative    58. Mouse Negative    59. Tobacco Leaf Negative              Reducing Pollen Exposure  The American Academy of Allergy, Asthma and Immunology suggests the following steps to reduce your exposure to pollen during allergy seasons.    Do not hang sheets or clothing out to dry; pollen may collect on these items. Do not mow lawns or spend time around freshly cut grass; mowing stirs up pollen. Keep windows closed at night.  Keep car windows closed while driving. Minimize morning activities outdoors, a time when pollen counts are usually at their highest. Stay indoors as much as possible when pollen counts or humidity is high and on windy days when pollen tends to remain in the air longer. Use air conditioning when possible.  Many air conditioners have filters that trap the pollen spores. Use a HEPA room air filter to remove pollen form the indoor air you breathe.  Control of Dust Mite Allergen    Dust mites play a major role in allergic asthma and rhinitis.  They occur in environments with high humidity wherever human skin is found.  Dust mites absorb humidity from the atmosphere (ie, they do not drink) and  feed on organic matter (including shed human and animal skin).  Dust mites are a microscopic type of insect that you cannot see with the naked eye.  High levels of dust mites have been detected from mattresses, pillows, carpets, upholstered furniture, bed covers, clothes, soft toys and any woven material.  The principal allergen of the dust mite is found in its feces.  A gram of dust may contain 1,000 mites and 250,000 fecal particles.  Mite antigen is easily measured in the air during house cleaning activities.  Dust mites do not bite and do  not cause harm to humans, other than by triggering allergies/asthma.    Ways to decrease your exposure to dust mites in your home:  Encase mattresses, box springs and pillows with a mite-impermeable barrier or cover   Wash sheets, blankets and drapes weekly in hot water (130 F) with detergent and dry them in a dryer on the hot setting.  Have the room cleaned frequently with a vacuum cleaner and a damp dust-mop.  For carpeting or rugs, vacuuming with a vacuum cleaner equipped with a high-efficiency particulate air (HEPA) filter.  The dust mite allergic individual should not be in a room which is being cleaned and should wait 1 hour after cleaning before going into the room. Do not sleep on upholstered furniture (eg, couches).   If possible removing carpeting, upholstered furniture and drapery from the home is ideal.  Horizontal blinds should be eliminated in the rooms where the person spends the most time (bedroom, study, television room).  Washable vinyl, roller-type shades are optimal. Remove all non-washable stuffed toys from the bedroom.  Wash stuffed toys weekly like sheets and blankets above.   Reduce indoor humidity to less than 50%.  Inexpensive humidity monitors can be purchased at most hardware stores.  Do not use a humidifier as can make the problem worse and are not recommended.

## 2022-04-25 ENCOUNTER — Encounter: Payer: Self-pay | Admitting: *Deleted

## 2022-05-24 ENCOUNTER — Encounter: Payer: Self-pay | Admitting: Internal Medicine

## 2022-05-24 ENCOUNTER — Ambulatory Visit: Payer: Commercial Managed Care - PPO | Admitting: Internal Medicine

## 2022-05-24 ENCOUNTER — Other Ambulatory Visit (HOSPITAL_COMMUNITY): Payer: Self-pay

## 2022-05-24 VITALS — BP 124/80 | HR 80 | Ht 72.0 in | Wt 260.0 lb

## 2022-05-24 DIAGNOSIS — K219 Gastro-esophageal reflux disease without esophagitis: Secondary | ICD-10-CM | POA: Diagnosis not present

## 2022-05-24 DIAGNOSIS — K2970 Gastritis, unspecified, without bleeding: Secondary | ICD-10-CM

## 2022-05-24 DIAGNOSIS — K2270 Barrett's esophagus without dysplasia: Secondary | ICD-10-CM | POA: Diagnosis not present

## 2022-05-24 MED ORDER — PANTOPRAZOLE SODIUM 40 MG PO TBEC
40.0000 mg | DELAYED_RELEASE_TABLET | Freq: Two times a day (BID) | ORAL | 3 refills | Status: DC
  Filled 2022-05-24 – 2022-06-09 (×2): qty 180, 90d supply, fill #0
  Filled 2022-09-05: qty 180, 90d supply, fill #1
  Filled 2022-12-05: qty 180, 90d supply, fill #2
  Filled 2023-03-06: qty 180, 90d supply, fill #3

## 2022-05-24 NOTE — Progress Notes (Signed)
   Subjective:    Patient ID: Antonio Ward, male    DOB: [birth year], 47 y.o.   MRN: [id number]  HPI Antonio Ward is a 47 year old male with a history of GERD, long segment Barrett's esophagus without dysplasia who is here for follow-up.  He is here alone today.  He came for upper endoscopy and colonoscopy which I performed on 03/03/2022: EGD: Long segment Barrett's esophagus, 6 cm in length.  Diffuse erythematous mucosa in the proximal stomach which was biopsied.  Exam otherwise normal.  Pathology Barrett's esophagus without dysplasia at all levels.  Gastric biopsies chronic gastritis negative for H. pylori. Colonoscopy: Sigmoid diverticulosis, medium sized internal hemorrhoids; otherwise normal exam.  He reports that he has done very well with the increase pantoprazole to 40 mg twice daily before meals.  He is had much less burping and indigestion.  Occasional "twinges" of heartburn but this is so much better than before.  Not needed Tums or Pepcid.  No nocturnal awakenings with reflux events.  No dysphagia or odynophagia.  Bowel movements regular.  He has gained about 50 pounds in the last for 5 years.  He attributes this to change in his work environment with less activity but also the [place] pandemic.  He feels that if he is able to lose meaningful amount of weight his reflux would likely get better.  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  Review of Systems As per HPI, otherwise negative  Current Medications, Allergies, Past Medical History, Past Surgical History, Family History and Social History were reviewed in [employer] record.    Objective:   Physical Exam BP 124/80   Pulse 80   Ht 6' (1.829 m)   Wt 260 lb (117.9 kg)   SpO2 97%   BMI 35.26 kg/m  Gen: awake, alert, NAD HEENT: anicteric  CV: RRR, no mrg Pulm: CTA b/l Abd: soft, NT/ND, +BS throughout Ext: no c/c/e Neuro: nonfocal     Assessment & Plan:  47 year old male with a history  of GERD, long segment Barrett's esophagus without dysplasia who is here for follow-up.  GERD/long segment Barrett's esophagus without dysplasia --we spent time today discussing Barrett's esophagus, current management strategies as well as risks over time.  It is very important that he maintain good control of reflux to lower the risk of Barrett's becoming dysplastic over time.  It appears that we are now doing that with twice daily PPI.  We did discuss the relative low risk as opposed to benefits for him of chronic PPI therapy.  Following recommendations: -- Continue pantoprazole 40 mg twice daily AC -- Upper endoscopy at 3 years for surveillance -- Annual B12 and magnesium level with primary care labs -- Weight loss would likely improve reflux over time  2.  Colon cancer screening --normal colonoscopy in December, repeat in 10 years thus December 2033.  Annual follow-up, sooner if needed  30 minutes total spent today including patient facing time, coordination of care, reviewing medical history/procedures/pertinent radiology studies, and documentation of the encounter.

## 2022-05-24 NOTE — Patient Instructions (Signed)
We have sent the following medications to your pharmacy for you to pick up at your convenience: Pantoprazole   Follow up in 1 year- sooner if needed.   _______________________________________________________  If your blood pressure at your visit was 140/90 or greater, please contact your primary care physician to follow up on this.  _______________________________________________________  If you are age 49 or older, your body mass index should be between 23-30. Your Body mass index is 35.26 kg/m. If this is out of the aforementioned range listed, please consider follow up with your Primary Care Provider.  If you are age 38 or younger, your body mass index should be between 19-25. Your Body mass index is 35.26 kg/m. If this is out of the aformentioned range listed, please consider follow up with your Primary Care Provider.   ________________________________________________________  The [facility] providers would like to encourage you to use [facility] to communicate with providers for non-urgent requests or questions.  Due to long hold times on the telephone, sending your provider a message by [facility] may be a faster and more efficient way to get a response.  Please allow 48 business hours for a response.  Please remember that this is for non-urgent requests.  _______________________________________________________  Thank you for choosing me and [facility].  Dr. Ulice Dash Pyrtle

## 2022-06-08 ENCOUNTER — Other Ambulatory Visit (HOSPITAL_COMMUNITY): Payer: Self-pay

## 2022-06-09 ENCOUNTER — Other Ambulatory Visit (HOSPITAL_COMMUNITY): Payer: Self-pay

## 2022-09-09 ENCOUNTER — Encounter: Payer: Self-pay | Admitting: Family Medicine

## 2022-09-09 ENCOUNTER — Ambulatory Visit: Payer: Commercial Managed Care - PPO | Admitting: Family Medicine

## 2022-09-09 VITALS — BP 98/70 | HR 64 | Temp 97.6°F | Ht 72.0 in | Wt 251.4 lb

## 2022-09-09 DIAGNOSIS — K219 Gastro-esophageal reflux disease without esophagitis: Secondary | ICD-10-CM

## 2022-09-09 DIAGNOSIS — R42 Dizziness and giddiness: Secondary | ICD-10-CM

## 2022-09-09 DIAGNOSIS — H9190 Unspecified hearing loss, unspecified ear: Secondary | ICD-10-CM | POA: Diagnosis not present

## 2022-09-09 NOTE — Patient Instructions (Signed)
I will set up referral to audiologist.

## 2022-09-09 NOTE — Progress Notes (Unsigned)
Established Patient Office Visit  Subjective   Patient ID: [name], male    DOB: October 15, 1975  Age: 47 y.o. MRN: [id number]  No chief complaint on file.   HPI  {History (Optional):23778} Antonio Ward is seen today with some recent relatively vague symptoms.  He states he is felt lightheaded on occasion.  This is not necessarily positional.  He states he had a couple times where he felt "cloudy" feeling "come over him.  No confusion.  No chest pain.  Stays well-hydrated.  Does not drink any regular caffeinated beverages and no alcohol.  He had coronary calcium score of 0 2022  He is quite busy.  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  Does not use any illicit substances.  He has noted what he thinks is some decline in hearing on the right side recently but no tinnitus and no vertigo.  Would like audiology assessment.  Has history of GERD and had endoscopy procedure several months ago which showed Barrett's esophagus.  Now takes Protonix 40 mg daily  Past Medical History:  Diagnosis Date   Barrett's esophagus    Diverticulosis    GERD (gastroesophageal reflux disease)    Headache    Internal hemorrhoids    Past Surgical History:  Procedure Laterality Date   right leg severely broken Right 03/28/1997   titanium rod, plate and 8 screws   TONSILLECTOMY  03/28/1978   UPPER GASTROINTESTINAL ENDOSCOPY     WISDOM TOOTH EXTRACTION  03/28/1996    reports that he quit smoking about 19 years ago. His smoking use included cigarettes. He has a 10.00 pack-year smoking history. He has never used smokeless tobacco. He reports that he does not drink alcohol and does not use drugs. family history includes Alcohol abuse in his mother; Cancer in his paternal grandmother; Cervical cancer in his maternal grandmother; Depression in his mother; Heart disease in his father; Hypertension in his father. Allergies  Allergen Reactions   Penicillins     Per patient unknown  action noted in childhood    Review of Systems  Constitutional:  Negative for fever and malaise/fatigue.  Eyes:  Negative for blurred vision.  Respiratory:  Negative for shortness of breath.   Cardiovascular:  Negative for chest pain.  Genitourinary:  Negative for dysuria.  Neurological:  Positive for dizziness. Negative for tremors, sensory change, speech change, focal weakness, seizures, loss of consciousness, weakness and headaches.      Objective:     BP 98/70 (BP Location: Left Arm, Patient Position: Sitting, Cuff Size: Large)   Pulse 64   Temp 97.6 F (36.4 C) (Oral)   Ht 6' (1.829 m)   Wt 251 lb 6.4 oz (114 kg)   SpO2 96%   BMI 34.10 kg/m  {Vitals History (Optional):23777}  Physical Exam Vitals reviewed.  Constitutional:      General: He is not in acute distress.    Appearance: Normal appearance. He is not ill-appearing.  Cardiovascular:     Rate and Rhythm: Normal rate and regular rhythm.     Heart sounds: No murmur heard. Pulmonary:     Effort: Pulmonary effort is normal.     Breath sounds: Normal breath sounds. No wheezing or rales.  Musculoskeletal:     Cervical back: Neck supple.  Neurological:     General: No focal deficit present.     Mental Status: He is alert.     Cranial Nerves: No cranial  nerve deficit.     Motor: No weakness.     Gait: Gait normal.      No results found for any visits on 09/09/22.  {Labs (Optional):23779}  The 10-year ASCVD risk score (Arnett DK, et al., 2019) is: 1.4%    Assessment & Plan:   #1 intermittent episodes of mild intermittent dizziness.  Lowish blood pressure today was 98/70 and this was repeated both seated and standing and obtained same reading.  No orthostatic change.  Etiology unclear.  Nonfocal exam.  -Check further labs with CBC, TSH, CMP. -Stay well-hydrated -Follow-up immediately for any new symptoms or progression of symptoms  #2 subjective hearing loss right ear.  No cerumen.  Set up audiology  referral.  He does not describe any tinnitus or vertigo  #3 history of GERD with Barrett's changes with previous biopsy.  Patient currently on Protonix and symptomatically stable.   No follow-ups on file.    Evelena Peat, MD

## 2022-09-10 LAB — CBC WITH DIFFERENTIAL/PLATELET
Basophils Absolute: 0.1 10*3/uL (ref 0.0–0.2)
Basos: 1 %
EOS (ABSOLUTE): 0.2 10*3/uL (ref 0.0–0.4)
Eos: 4 %
Hematocrit: 48 % (ref 37.5–51.0)
Hemoglobin: 15.5 g/dL (ref 13.0–17.7)
Immature Grans (Abs): 0 10*3/uL (ref 0.0–0.1)
Immature Granulocytes: 0 %
Lymphocytes Absolute: 1.3 10*3/uL (ref 0.7–3.1)
Lymphs: 22 %
MCH: 28.1 pg (ref 26.6–33.0)
MCHC: 32.3 g/dL (ref 31.5–35.7)
MCV: 87 fL (ref 79–97)
Monocytes Absolute: 0.5 10*3/uL (ref 0.1–0.9)
Monocytes: 8 %
Neutrophils Absolute: 3.8 10*3/uL (ref 1.4–7.0)
Neutrophils: 65 %
Platelets: 279 10*3/uL (ref 150–450)
RBC: 5.51 x10E6/uL (ref 4.14–5.80)
RDW: 13.4 % (ref 11.6–15.4)
WBC: 5.9 10*3/uL (ref 3.4–10.8)

## 2022-09-10 LAB — COMPREHENSIVE METABOLIC PANEL
ALT: 22 IU/L (ref 0–44)
AST: 22 IU/L (ref 0–40)
Albumin: 4.7 g/dL (ref 4.1–5.1)
Alkaline Phosphatase: 62 IU/L (ref 44–121)
BUN/Creatinine Ratio: 11 (ref 9–20)
BUN: 13 mg/dL (ref 6–24)
Bilirubin Total: 0.5 mg/dL (ref 0.0–1.2)
CO2: 24 mmol/L (ref 20–29)
Calcium: 9.5 mg/dL (ref 8.7–10.2)
Chloride: 104 mmol/L (ref 96–106)
Creatinine, Ser: 1.17 mg/dL (ref 0.76–1.27)
Globulin, Total: 2.8 g/dL (ref 1.5–4.5)
Glucose: 82 mg/dL (ref 70–99)
Potassium: 4.5 mmol/L (ref 3.5–5.2)
Sodium: 142 mmol/L (ref 134–144)
Total Protein: 7.5 g/dL (ref 6.0–8.5)
eGFR: 77 mL/min/{1.73_m2} (ref >59)

## 2022-09-10 LAB — TSH: TSH: 2.45 u[IU]/mL (ref 0.450–4.500)

## 2022-09-12 NOTE — Addendum Note (Signed)
Addended by: Christy Sartorius on: 09/12/2022 02:51 PM   Modules accepted: Orders

## 2022-09-15 ENCOUNTER — Other Ambulatory Visit: Payer: Commercial Managed Care - PPO

## 2022-09-15 ENCOUNTER — Ambulatory Visit: Payer: Commercial Managed Care - PPO | Attending: Family Medicine | Admitting: Audiologist

## 2022-09-15 ENCOUNTER — Telehealth: Payer: Self-pay

## 2022-09-15 DIAGNOSIS — H9190 Unspecified hearing loss, unspecified ear: Secondary | ICD-10-CM

## 2022-09-15 DIAGNOSIS — H938X1 Other specified disorders of right ear: Secondary | ICD-10-CM | POA: Diagnosis not present

## 2022-09-15 DIAGNOSIS — H9193 Unspecified hearing loss, bilateral: Secondary | ICD-10-CM | POA: Diagnosis not present

## 2022-09-15 DIAGNOSIS — R42 Dizziness and giddiness: Secondary | ICD-10-CM

## 2022-09-15 LAB — HEMOGLOBIN A1C: Hgb A1c MFr Bld: 5.8 % (ref 4.6–6.5)

## 2022-09-15 NOTE — Telephone Encounter (Signed)
-----   Message from Kristian Covey, MD sent at 09/15/2022  3:21 PM EDT ----- Set up ENT referral for patient.   Subjective ear symptoms with normal audiology screen.   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 No follow up with audiology needed. Devarion would like referral to Otolaryngology. Please see report for details.  Ammie Ferrier Au.D.  Audiologist

## 2022-09-15 NOTE — Procedures (Addendum)
  Outpatient Audiology and [facility] [address] [place], [place]  [address] [phone]  AUDIOLOGICAL  EVALUATION  NAME: Antonio Ward     DOB:   [birth year]      MRN: [id number]                                                                                     DATE: 09/15/2022     REFERENT: Kristian Covey, MD STATUS: Outpatient DIAGNOSIS: Decreased Hearing Bilaterally    History: Laterrian was seen for an audiological evaluation. Rontavius was accompanied to the appointment by his son.  Napolean is receiving a hearing evaluation due to concerns for muffled hearing in his right ear only. Jaleil has difficulty hearing in his right ear. This difficulty began gradually over the last six months. A popping sound was reported in his right ear only when rubbing in a circular pattern behind his right ear. No pain or pressure reported in either ear. No tinnitus. Moksh no history of noise exposure. Jabril reported experiencing some dizziness  in the last three months, it stops when he closes his eyes. Dontell reported that his right mastoid bone was more prominent than the left.  No risk factors for hearing loss reported. No other relevant case history reported.   Evaluation:  Otoscopy showed a clear view of the tympanic membranes, bilaterally Tympanometry results were consistent with normal middle ear function and tympanic membrane movement bilaterally  (Type A).  Audiometric testing was completed using conventional audiometry with supraural transducer. Speech Recognition Thresholds were 15 dB in the right ear and 15 dB in the left ear. Word Recognition was performed 55 dB SL, scored 100 % in the right ear and 100 in the left ear. Pure tone thresholds show normal hearing in the right and left ears from  [phone] Hz, and 4000-8000 Hz. A very slight hearing loss was noted bilaterally  at 3000 Hz.  Results:  The test results were reviewed with Ibrahim and his son. Otoscopy and tympanometry indicate  normal outer and middle ear anatomy and function.  Audiometry suggests essentially normal hearing sensitivity. Testing did not indicate the cause of the popping in the right ear. Tegan requested to be referred to an ENT to further investigate the popping in his right ear and the more prominent right mastoid bone.   Recommendations: No further audiological testing is recommended at this time unless future hearing concerns arise. Referral to ENT Physician necessary due to concerns for popping sound in right ear and more prominent right mastoid bone.    30 minutes spent testing and counseling on results.   Ammie Ferrier  Audiologist, Au.D., CCC-A 09/15/2022  1:22 PM  Shakeria Robinette Tera Partridge, MS Audiology Student    Cc: Kristian Covey, MD

## 2022-09-15 NOTE — Telephone Encounter (Signed)
Referral placed and patient aware 

## 2022-10-06 ENCOUNTER — Ambulatory Visit: Payer: Commercial Managed Care - PPO | Admitting: Allergy & Immunology

## 2022-11-15 DIAGNOSIS — H93291 Other abnormal auditory perceptions, right ear: Secondary | ICD-10-CM | POA: Diagnosis not present

## 2022-11-15 DIAGNOSIS — R519 Headache, unspecified: Secondary | ICD-10-CM | POA: Diagnosis not present

## 2022-12-07 ENCOUNTER — Other Ambulatory Visit (HOSPITAL_COMMUNITY): Payer: Self-pay

## 2023-02-10 DIAGNOSIS — M25871 Other specified joint disorders, right ankle and foot: Secondary | ICD-10-CM | POA: Diagnosis not present

## 2023-02-14 ENCOUNTER — Ambulatory Visit (INDEPENDENT_AMBULATORY_CARE_PROVIDER_SITE_OTHER): Payer: Commercial Managed Care - PPO | Admitting: Family Medicine

## 2023-02-14 ENCOUNTER — Encounter: Payer: Self-pay | Admitting: Family Medicine

## 2023-02-14 VITALS — BP 120/80 | HR 88 | Temp 97.8°F | Ht 72.05 in | Wt 256.4 lb

## 2023-02-14 DIAGNOSIS — Z0000 Encounter for general adult medical examination without abnormal findings: Secondary | ICD-10-CM

## 2023-02-14 LAB — HEPATIC FUNCTION PANEL
ALT: 22 U/L (ref 0–53)
AST: 19 U/L (ref 0–37)
Albumin: 4.5 g/dL (ref 3.5–5.2)
Alkaline Phosphatase: 51 U/L (ref 39–117)
Bilirubin, Direct: 0.2 mg/dL (ref 0.0–0.3)
Total Bilirubin: 0.7 mg/dL (ref 0.2–1.2)
Total Protein: 7.3 g/dL (ref 6.0–8.3)

## 2023-02-14 LAB — CBC WITH DIFFERENTIAL/PLATELET
Basophils Absolute: 0.2 10*3/uL — ABNORMAL HIGH (ref 0.0–0.1)
Basophils Relative: 3.2 % — ABNORMAL HIGH (ref 0.0–3.0)
Eosinophils Absolute: 0.4 10*3/uL (ref 0.0–0.7)
Eosinophils Relative: 8.1 % — ABNORMAL HIGH (ref 0.0–5.0)
HCT: 47.6 % (ref 39.0–52.0)
Hemoglobin: 15.7 g/dL (ref 13.0–17.0)
Lymphocytes Relative: 20.3 % (ref 12.0–46.0)
Lymphs Abs: 1 10*3/uL (ref 0.7–4.0)
MCHC: 33 g/dL (ref 30.0–36.0)
MCV: 87 fL (ref 78.0–100.0)
Monocytes Absolute: 0.4 10*3/uL (ref 0.1–1.0)
Monocytes Relative: 7.9 % (ref 3.0–12.0)
Neutro Abs: 3 10*3/uL (ref 1.4–7.7)
Neutrophils Relative %: 60.5 % (ref 43.0–77.0)
Platelets: 270 10*3/uL (ref 150.0–400.0)
RBC: 5.47 Mil/uL (ref 4.22–5.81)
RDW: 14.3 % (ref 11.5–15.5)
WBC: 5 10*3/uL (ref 4.0–10.5)

## 2023-02-14 LAB — PSA: PSA: 1.19 ng/mL (ref 0.10–4.00)

## 2023-02-14 LAB — LIPID PANEL
Cholesterol: 148 mg/dL (ref 0–200)
HDL: 45.7 mg/dL (ref >39.00)
LDL Cholesterol: 87 mg/dL (ref 0–99)
NonHDL: 102.15
Total CHOL/HDL Ratio: 3
Triglycerides: 74 mg/dL (ref 0.0–149.0)
VLDL: 14.8 mg/dL (ref 0.0–40.0)

## 2023-02-14 LAB — BASIC METABOLIC PANEL
BUN: 18 mg/dL (ref 6–23)
CO2: 27 meq/L (ref 19–32)
Calcium: 9.3 mg/dL (ref 8.4–10.5)
Chloride: 104 meq/L (ref 96–112)
Creatinine, Ser: 1.08 mg/dL (ref 0.40–1.50)
GFR: 80.75 mL/min (ref >60.00)
Glucose, Bld: 100 mg/dL — ABNORMAL HIGH (ref 70–99)
Potassium: 4.3 meq/L (ref 3.5–5.1)
Sodium: 139 meq/L (ref 135–145)

## 2023-02-14 LAB — HEMOGLOBIN A1C: Hgb A1c MFr Bld: 5.8 % (ref 4.6–6.5)

## 2023-02-14 NOTE — Progress Notes (Signed)
Established Patient Office Visit  Subjective   Patient ID: Antonio Ward, male    DOB: 12-10-1975  Age: 47 y.o. MRN: [id number]  Chief Complaint  Patient presents with   Annual Exam    HPI   Antonio Ward is here for complete physical.  Had concern over the summer for some possible hearing loss.  Saw audiologist and ENT had no abnormalities noted.  No documented objective hearing loss.  He does have history of GERD and had colonoscopy and EGD last year which showed Barrett's esophagus.  Symptoms currently controlled on Protonix 40 mg daily and supplements with Pepcid 20 mg daily.  No recent dysphagia.  Has lost about 7 pounds from last year and is trying to scale back.  He hopes to lose about 50 pounds more.  Stays very busy with 2 bar/restaurants.  Does still exercise but somewhat inconsistently.  Health maintenance reviewed:  Health Maintenance  Topic Date Due   HIV Screening  Never done   DTaP/Tdap/Td (3 - Tdap) 06/16/2019   COVID-19 Vaccine (4 - 2023-24 season) 11/27/2022   Colonoscopy  03/03/2032   Hepatitis C Screening  Completed   HPV VACCINES  Aged Out   INFLUENZA VACCINE  Discontinued   -Declines tetanus and flu vaccine today -Prior hepatitis C screen negative  Social history-married with 4 children.  Quit smoking 2004.  No alcohol.  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  Family history-mother is age 22 with history of alcohol abuse.  Does not stay in touch much with her.  Father had history of CAD and congestive heart failure.  Past Medical History:  Diagnosis Date   Barrett's esophagus    Diverticulosis    GERD (gastroesophageal reflux disease)    Headache    Internal hemorrhoids    Past Surgical History:  Procedure Laterality Date   right leg severely broken Right 03/28/1997   titanium rod, plate and 8 screws   TONSILLECTOMY  03/28/1978   UPPER GASTROINTESTINAL ENDOSCOPY     WISDOM TOOTH EXTRACTION  03/28/1996    reports that he quit  smoking about 20 years ago. His smoking use included cigarettes. He started smoking about 40 years ago. He has a 10 pack-year smoking history. He has never used smokeless tobacco. He reports that he does not drink alcohol and does not use drugs. family history includes Alcohol abuse in his mother; Cancer in his paternal grandmother; Cervical cancer in his maternal grandmother; Depression in his mother; Heart disease in his father; Hypertension in his father. Allergies  Allergen Reactions   Penicillins     Per patient unknown action noted in childhood      Review of Systems  Constitutional:  Negative for chills, fever, malaise/fatigue and weight loss.  HENT:  Negative for hearing loss.   Eyes:  Negative for blurred vision and double vision.  Respiratory:  Negative for cough and shortness of breath.   Cardiovascular:  Negative for chest pain, palpitations and leg swelling.  Gastrointestinal:  Negative for abdominal pain, blood in stool, constipation and diarrhea.  Genitourinary:  Negative for dysuria.  Skin:  Negative for rash.  Neurological:  Negative for dizziness, speech change, seizures, loss of consciousness and headaches.  Psychiatric/Behavioral:  Negative for depression.       Objective:     BP 120/80 (BP Location: Left Arm, Patient Position: Sitting, Cuff Size: Normal)   Pulse 88   Temp 97.8 F (36.6 C) (Oral)   Ht 6' 0.05" (1.83 m)  Wt 256 lb 6.4 oz (116.3 kg)   SpO2 96%   BMI 34.73 kg/m  BP Readings from Last 3 Encounters:  02/14/23 120/80  09/09/22 98/70  05/24/22 124/80   Wt Readings from Last 3 Encounters:  02/14/23 256 lb 6.4 oz (116.3 kg)  09/09/22 251 lb 6.4 oz (114 kg)  05/24/22 260 lb (117.9 kg)      Physical Exam Vitals reviewed.  Constitutional:      General: He is not in acute distress.    Appearance: He is well-developed.  HENT:     Head: Normocephalic and atraumatic.     Right Ear: External ear normal.     Left Ear: External ear normal.   Eyes:     Pupils: Pupils are equal, round, and reactive to light.  Neck:     Thyroid: No thyromegaly.  Cardiovascular:     Rate and Rhythm: Normal rate and regular rhythm.     Heart sounds: Normal heart sounds. No murmur heard. Pulmonary:     Effort: No respiratory distress.     Breath sounds: No wheezing or rales.  Abdominal:     General: Bowel sounds are normal. There is no distension.     Palpations: Abdomen is soft. There is no mass.     Tenderness: There is no abdominal tenderness. There is no guarding or rebound.  Musculoskeletal:     Cervical back: Normal range of motion and neck supple.     Right lower leg: No edema.     Left lower leg: No edema.  Lymphadenopathy:     Cervical: No cervical adenopathy.  Skin:    Findings: No rash.  Neurological:     Mental Status: He is alert and oriented to person, place, and time.     Cranial Nerves: No cranial nerve deficit.      No results found for any visits on 02/14/23.    The 10-year ASCVD risk score (Arnett DK, et al., 2019) is: 2.2%    Assessment & Plan:   Problem List Items Addressed This Visit   None Visit Diagnoses     Physical exam    -  Primary   Relevant Orders   Basic metabolic panel   Lipid panel   CBC with Differential/Platelet   Hepatic function panel   PSA   Hemoglobin A13c     47 year old male with history of GERD/Barrett's esophagus currently controlled on PPI with Protonix.  We discussed the following health maintenance items  -Agree with his goal to try to lose some weight with a goal around 200 pounds -Try to establish more consistent exercise -Obtain follow-up labs.  Include A1c with prior history of prediabetes range blood sugar with A1c of 5.8 -Colonoscopy up-to-date as above -Offered flu vaccine and he declines -Offered tetanus and he declines  No follow-ups on file.    Evelena Peat, MD

## 2023-05-02 ENCOUNTER — Telehealth: Payer: Self-pay

## 2023-05-02 NOTE — Telephone Encounter (Signed)
Copied from CRM [id number]. Topic: General - Other >> May 02, 2023  8:15 AM Truddie Crumble wrote: Reason for CRM: shameria from [employer] called stating they faxed a medical consultation form on 1/15 and have not received it back CB [phone]

## 2023-05-04 NOTE — Telephone Encounter (Signed)
 Noted.

## 2023-06-02 ENCOUNTER — Other Ambulatory Visit (HOSPITAL_COMMUNITY): Payer: Self-pay

## 2023-06-02 ENCOUNTER — Other Ambulatory Visit: Payer: Self-pay | Admitting: Internal Medicine

## 2023-06-02 DIAGNOSIS — K2270 Barrett's esophagus without dysplasia: Secondary | ICD-10-CM

## 2023-06-02 DIAGNOSIS — K2970 Gastritis, unspecified, without bleeding: Secondary | ICD-10-CM

## 2023-06-02 MED ORDER — PANTOPRAZOLE SODIUM 40 MG PO TBEC
40.0000 mg | DELAYED_RELEASE_TABLET | Freq: Two times a day (BID) | ORAL | 0 refills | Status: DC
  Filled 2023-06-02: qty 60, 30d supply, fill #0
  Filled 2023-06-02: qty 120, 60d supply, fill #0

## 2023-08-27 ENCOUNTER — Other Ambulatory Visit: Payer: Self-pay | Admitting: Internal Medicine

## 2023-08-27 DIAGNOSIS — K2270 Barrett's esophagus without dysplasia: Secondary | ICD-10-CM

## 2023-08-27 DIAGNOSIS — K2970 Gastritis, unspecified, without bleeding: Secondary | ICD-10-CM

## 2023-08-28 ENCOUNTER — Other Ambulatory Visit (HOSPITAL_COMMUNITY): Payer: Self-pay

## 2023-08-28 ENCOUNTER — Encounter (HOSPITAL_COMMUNITY): Payer: Self-pay

## 2023-08-28 ENCOUNTER — Telehealth: Payer: Self-pay | Admitting: Internal Medicine

## 2023-08-28 DIAGNOSIS — K2270 Barrett's esophagus without dysplasia: Secondary | ICD-10-CM

## 2023-08-28 DIAGNOSIS — K2970 Gastritis, unspecified, without bleeding: Secondary | ICD-10-CM

## 2023-08-28 MED ORDER — PANTOPRAZOLE SODIUM 40 MG PO TBEC
40.0000 mg | DELAYED_RELEASE_TABLET | Freq: Two times a day (BID) | ORAL | 0 refills | Status: DC
  Filled 2023-08-28: qty 180, 90d supply, fill #0

## 2023-08-28 NOTE — Telephone Encounter (Signed)
 Inbound call from patient, would like refill for pantoprazole  called into [facility] pharmacy.

## 2023-08-28 NOTE — Telephone Encounter (Signed)
 Patient is scheduled to see Brigitte Canard, PA on 11/08/23 at 10:20 am. Prescription sent to patient's pharmacy until scheduled appt.

## 2023-08-30 ENCOUNTER — Other Ambulatory Visit (HOSPITAL_COMMUNITY): Payer: Self-pay

## 2023-11-07 NOTE — Progress Notes (Signed)
 Ellouise Console, PA-C [address] [place], [place]  [address] Phone: [phone]   Primary Care Physician: Micheal Wolm ORN, MD  Primary Gastroenterologist:  Ellouise Console, PA-C / Dr. Gordy Starch   Chief Complaint: Follow-up GERD and Barrett's; med refill       HPI:   Antonio Ward is a 48 y.o. male, established patient Dr. Starch, returns for annual follow-up of GERD and Barrett's esophagus.  Has history of long segment Barrett's esophagus without dysplasia.  Needs medication refill.  Currently taking pantoprazole  40 Mg twice daily.  Current symptoms: His acid reflux is under good control on high-dose PPI.  He tried and failed pantoprazole  20 Mg twice daily and omeprazole  20 Mg twice daily.  Those doses did not control his reflux.  He also tried pantoprazole  40 Mg once daily which did not control reflux.  Pantoprazole  40 Mg twice daily dose works the best.  He still has occasional episode of breakthrough heartburn which is relieved with taking OTC Gas-X or Tums.  He reports having history of severe acid reflux since 2013.  No other GI symptoms or concerns today.  02/2022 last EGD:  Long segment Barrett's esophagus, 6 cm in length. Diffuse erythematous mucosa in the proximal stomach which was biopsied. Exam otherwise normal. Pathology Barrett's esophagus without dysplasia at all levels. Gastric biopsies chronic gastritis negative for H. pylori.  3-year repeat (due 02/2025).  02/2022 colonoscopy:  Sigmoid diverticulosis, medium sized internal hemorrhoids; otherwise normal exam.  10-year repeat.  Current Outpatient Medications  Medication Sig Dispense Refill   famotidine (PEPCID) 20 MG tablet Take 20 mg by mouth at bedtime. (Patient not taking: Reported on 11/08/2023)     pantoprazole  (PROTONIX ) 40 MG tablet Take 1 tablet (40 mg total) by mouth 2 (two) times daily before a meal. 180 tablet 3   No current facility-administered medications for this visit.    Allergies as of  11/08/2023 - Review Complete 11/08/2023  Allergen Reaction Noted   Penicillins  05/19/2020    Past Medical History:  Diagnosis Date   Barrett's esophagus    Diverticulosis    GERD (gastroesophageal reflux disease)    Headache    Internal hemorrhoids     Past Surgical History:  Procedure Laterality Date   right leg severely broken Right 03/28/1997   titanium rod, plate and 8 screws   TONSILLECTOMY  03/28/1978   UPPER GASTROINTESTINAL ENDOSCOPY     WISDOM TOOTH EXTRACTION  03/28/1996    Review of Systems:    All systems reviewed and negative except where noted in HPI.    Physical Exam:  BP 114/70   Pulse 83   Ht 6' (1.829 m)   Wt 225 lb 8 oz (102.3 kg)   BMI 30.58 kg/m  No LMP for male patient.  General: Well-nourished, well-developed in no acute distress.  Neuro: Alert and oriented x 3.  Grossly intact.  Psych: Alert and cooperative, normal mood and affect.   Imaging Studies: No results found.  Labs: CBC    Component Value Date/Time   WBC 5.0 02/14/2023 1006   RBC 5.47 02/14/2023 1006   HGB 15.7 02/14/2023 1006   HGB 15.5 09/09/2022 1513   HCT 47.6 02/14/2023 1006   HCT 48.0 09/09/2022 1513   PLT 270.0 02/14/2023 1006   PLT 279 09/09/2022 1513   MCV 87.0 02/14/2023 1006   MCV 87 09/09/2022 1513   MCH 28.1 09/09/2022 1513   MCH 29.4 01/18/2011 1825  MCHC 33.0 02/14/2023 1006   RDW 14.3 02/14/2023 1006   RDW 13.4 09/09/2022 1513   LYMPHSABS 1.0 02/14/2023 1006   LYMPHSABS 1.3 09/09/2022 1513   MONOABS 0.4 02/14/2023 1006   EOSABS 0.4 02/14/2023 1006   EOSABS 0.2 09/09/2022 1513   BASOSABS 0.2 (H) 02/14/2023 1006   BASOSABS 0.1 09/09/2022 1513    CMP     Component Value Date/Time   NA 139 02/14/2023 1006   NA 142 09/09/2022 1513   K 4.3 02/14/2023 1006   CL 104 02/14/2023 1006   CO2 27 02/14/2023 1006   GLUCOSE 100 (H) 02/14/2023 1006   BUN 18 02/14/2023 1006   BUN 13 09/09/2022 1513   CREATININE 1.08 02/14/2023 1006   CALCIUM 9.3  02/14/2023 1006   PROT 7.3 02/14/2023 1006   PROT 7.5 09/09/2022 1513   ALBUMIN 4.5 02/14/2023 1006   ALBUMIN 4.7 09/09/2022 1513   AST 19 02/14/2023 1006   ALT 22 02/14/2023 1006   ALKPHOS 51 02/14/2023 1006   BILITOT 0.7 02/14/2023 1006   BILITOT 0.5 09/09/2022 1513   GFRNONAA >90 01/18/2011 1825   GFRAA >90 01/18/2011 1825       Assessment and Plan:   Antonio Ward is a 48 y.o. y/o male returns for annual followup of:  1.  Chronic GERD:  Controlled on PPI.  He has required high-dose PPI. - Refilled pantoprazole  40 Mg twice daily. - GERD diet handout given. - Recommend Lifestyle Modifications to prevent Acid Reflux.  Rec. Avoid coffee, sodas, peppermint, garlic, onions, alcohol, citrus fruits, chocolate, tomatoes, fatty and spicey foods.  Avoid eating 2-3 hours before bedtime.    2.  Long segment Barrett's esophagus - 3-year repeat surveillance EGD will be due 02/2025.  3.  Colon cancer screening is up-to-date - Negative colonoscopy 02/2022.  10-year repeat due 2033.  4.  Long Term Use Medication: He requires High dose PPI to control GERD and for Barrett's. - Lab: Vitamin D , B12, Mg+, Iron. - He has Normal annual CBC and CMP labs through PCP. - We discussed adverse side effects of PPIs to include vitamin deficiencies, osteoporosis, renal insufficiency,. Recommend take lowest effective dose of PPI necessary to control acid reflux.  OK to add H2RB (Pepcid 20mg  daily) or antiacid if needed for breakthrough acid reflux.    Ellouise Console, PA-C  Follow up 1 year.

## 2023-11-08 ENCOUNTER — Other Ambulatory Visit (INDEPENDENT_AMBULATORY_CARE_PROVIDER_SITE_OTHER)

## 2023-11-08 ENCOUNTER — Other Ambulatory Visit (HOSPITAL_COMMUNITY): Payer: Self-pay

## 2023-11-08 ENCOUNTER — Encounter: Payer: Self-pay | Admitting: Physician Assistant

## 2023-11-08 ENCOUNTER — Ambulatory Visit: Admitting: Physician Assistant

## 2023-11-08 VITALS — BP 114/70 | HR 83 | Ht 72.0 in | Wt 225.5 lb

## 2023-11-08 DIAGNOSIS — Z79899 Other long term (current) drug therapy: Secondary | ICD-10-CM

## 2023-11-08 DIAGNOSIS — K2270 Barrett's esophagus without dysplasia: Secondary | ICD-10-CM | POA: Diagnosis not present

## 2023-11-08 DIAGNOSIS — K2100 Gastro-esophageal reflux disease with esophagitis, without bleeding: Secondary | ICD-10-CM

## 2023-11-08 DIAGNOSIS — K219 Gastro-esophageal reflux disease without esophagitis: Secondary | ICD-10-CM

## 2023-11-08 DIAGNOSIS — K2970 Gastritis, unspecified, without bleeding: Secondary | ICD-10-CM

## 2023-11-08 LAB — VITAMIN D 25 HYDROXY (VIT D DEFICIENCY, FRACTURES): VITD: 31.49 ng/mL (ref 30.00–100.00)

## 2023-11-08 LAB — VITAMIN B12: Vitamin B-12: 292 pg/mL (ref 211–911)

## 2023-11-08 LAB — IRON: Iron: 62 ug/dL (ref 42–165)

## 2023-11-08 MED ORDER — PANTOPRAZOLE SODIUM 40 MG PO TBEC
40.0000 mg | DELAYED_RELEASE_TABLET | Freq: Two times a day (BID) | ORAL | 3 refills | Status: AC
  Filled 2023-11-08 – 2023-12-01 (×4): qty 180, 90d supply, fill #0
  Filled 2024-02-19: qty 180, 90d supply, fill #1
  Filled ????-??-??: fill #0

## 2023-11-08 NOTE — Patient Instructions (Addendum)
 Your provider has requested that you go to the basement level for lab work before leaving today. Press B on the elevator. The lab is located at the first door on the left as you exit the elevator.  We have sent the following medications to your pharmacy for you to pick up at your convenience: Pantoprazole  40 mg twice daily before meals  Please follow up sooner if symptoms increase or worsen  Due to recent changes in healthcare laws, you may see the results of your imaging and laboratory studies on MyChart before your provider has had a chance to review them.  We understand that in some cases there may be results that are confusing or concerning to you. Not all laboratory results come back in the same time frame and the provider may be waiting for multiple results in order to interpret others.  Please give us  48 hours in order for your provider to thoroughly review all the results before contacting the office for clarification of your results.   Thank you for trusting me with your gastrointestinal care!   Ellouise Console, PA-C _______________________________________________________  If your blood pressure at your visit was 140/90 or greater, please contact your primary care physician to follow up on this.  _______________________________________________________  If you are age 67 or older, your body mass index should be between 23-30. Your Body mass index is 30.58 kg/m. If this is out of the aforementioned range listed, please consider follow up with your Primary Care Provider.  If you are age 68 or younger, your body mass index should be between 19-25. Your Body mass index is 30.58 kg/m. If this is out of the aformentioned range listed, please consider follow up with your Primary Care Provider.   ________________________________________________________  The [facility] providers would like to encourage you to use MYCHART to communicate with providers for non-urgent requests or questions.  Due  to long hold times on the telephone, sending your provider a message by [facility] may be a faster and more efficient way to get a response.  Please allow 48 business hours for a response.  Please remember that this is for non-urgent requests.  _______________________________________________________

## 2023-11-09 ENCOUNTER — Ambulatory Visit: Payer: Self-pay | Admitting: Physician Assistant

## 2023-11-20 ENCOUNTER — Other Ambulatory Visit (HOSPITAL_COMMUNITY): Payer: Self-pay

## 2023-11-29 ENCOUNTER — Other Ambulatory Visit (HOSPITAL_COMMUNITY): Payer: Self-pay

## 2023-12-01 ENCOUNTER — Other Ambulatory Visit (HOSPITAL_COMMUNITY): Payer: Self-pay

## 2024-02-16 ENCOUNTER — Ambulatory Visit (INDEPENDENT_AMBULATORY_CARE_PROVIDER_SITE_OTHER): Admitting: Family Medicine

## 2024-02-16 ENCOUNTER — Encounter: Payer: Self-pay | Admitting: Family Medicine

## 2024-02-16 VITALS — BP 110/76 | HR 97 | Temp 97.9°F | Ht 71.65 in | Wt 220.7 lb

## 2024-02-16 DIAGNOSIS — Z0000 Encounter for general adult medical examination without abnormal findings: Secondary | ICD-10-CM

## 2024-02-16 LAB — BASIC METABOLIC PANEL WITH GFR
BUN: 18 mg/dL (ref 6–23)
CO2: 29 meq/L (ref 19–32)
Calcium: 9.4 mg/dL (ref 8.4–10.5)
Chloride: 103 meq/L (ref 96–112)
Creatinine, Ser: 1.02 mg/dL (ref 0.40–1.50)
GFR: 85.87 mL/min (ref >60.00)
Glucose, Bld: 95 mg/dL (ref 70–99)
Potassium: 4.3 meq/L (ref 3.5–5.1)
Sodium: 138 meq/L (ref 135–145)

## 2024-02-16 LAB — CBC WITH DIFFERENTIAL/PLATELET
Basophils Absolute: 0.1 K/uL (ref 0.0–0.1)
Basophils Relative: 1 % (ref 0.0–3.0)
Eosinophils Absolute: 0.3 K/uL (ref 0.0–0.7)
Eosinophils Relative: 4.6 % (ref 0.0–5.0)
HCT: 44 % (ref 39.0–52.0)
Hemoglobin: 14.7 g/dL (ref 13.0–17.0)
Lymphocytes Relative: 18.7 % (ref 12.0–46.0)
Lymphs Abs: 1 K/uL (ref 0.7–4.0)
MCHC: 33.5 g/dL (ref 30.0–36.0)
MCV: 85.5 fl (ref 78.0–100.0)
Monocytes Absolute: 0.4 K/uL (ref 0.1–1.0)
Monocytes Relative: 7.1 % (ref 3.0–12.0)
Neutro Abs: 3.8 K/uL (ref 1.4–7.7)
Neutrophils Relative %: 68.6 % (ref 43.0–77.0)
Platelets: 253 K/uL (ref 150.0–400.0)
RBC: 5.14 Mil/uL (ref 4.22–5.81)
RDW: 14.1 % (ref 11.5–15.5)
WBC: 5.5 K/uL (ref 4.0–10.5)

## 2024-02-16 LAB — HEPATIC FUNCTION PANEL
ALT: 13 U/L (ref 0–53)
AST: 16 U/L (ref 0–37)
Albumin: 4.6 g/dL (ref 3.5–5.2)
Alkaline Phosphatase: 49 U/L (ref 39–117)
Bilirubin, Direct: 0.2 mg/dL (ref 0.0–0.3)
Total Bilirubin: 0.8 mg/dL (ref 0.2–1.2)
Total Protein: 7.3 g/dL (ref 6.0–8.3)

## 2024-02-16 LAB — LIPID PANEL
Cholesterol: 159 mg/dL (ref 0–200)
HDL: 49.7 mg/dL (ref >39.00)
LDL Cholesterol: 92 mg/dL (ref 0–99)
NonHDL: 109.62
Total CHOL/HDL Ratio: 3
Triglycerides: 89 mg/dL (ref 0.0–149.0)
VLDL: 17.8 mg/dL (ref 0.0–40.0)

## 2024-02-16 LAB — PSA: PSA: 0.89 ng/mL (ref 0.10–4.00)

## 2024-02-16 LAB — HEMOGLOBIN A1C: Hgb A1c MFr Bld: 5.5 % (ref 4.6–6.5)

## 2024-02-16 NOTE — Patient Instructions (Signed)
 Consider Prevnar 20 and Shingrix vaccines.

## 2024-02-16 NOTE — Progress Notes (Signed)
 Established Patient Office Visit  Subjective   Patient ID: Antonio Ward, male    DOB: Jul 31, 1975  Age: 48 y.o. MRN: [id number]  Chief Complaint  Patient presents with   Annual Exam    HPI   Attila seen today for physical exam.  He has history of GERD and had EGD couple years ago which showed Barrett's esophagus.  He is currently on Protonix  40 mg twice daily.  Still has occasional breakthrough symptoms but mostly controlled.  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  Generally doing well.  He is been very health-conscious in the past year is exercising most days for almost an hour and has lost about 40 pounds overall.  Feels well overall.  Has had limited swelling right axillary region occasionally with pressure expresses whitest substance.    He does have a history of prediabetes range blood sugars with last A1c 5.8%.  Requesting repeat today.  Health maintenance reviewed:  Health Maintenance  Topic Date Due   HIV Screening  Never done   Hepatitis B Vaccines 19-59 Average Risk (1 of 3 - 19+ 3-dose series) Never done   Zoster Vaccines- Shingrix (1 of 2) Never done   DTaP/Tdap/Td (3 - Tdap) 06/16/2019   COVID-19 Vaccine (4 - 2025-26 season) 11/27/2023   Pneumococcal Vaccine: 50+ Years (1 of 1 - PCV) 02/15/2025 (Originally 11/28/2023)   Colonoscopy  03/03/2032   Hepatitis C Screening  Completed   HPV VACCINES  Aged Out   Meningococcal B Vaccine  Aged Out   Influenza Vaccine  Discontinued   - Declines tetanus booster today.  Also declines pneumonia vaccine and Shingrix.  Colonoscopy due 2033.  EGD due 12-26  Social history-married with 4 children.  He has first grandchild granddaughter who is 76 weeks old.  He quit smoking 2004.  No alcohol.  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  History of CAD and congestive heart failure.  Mother has history of alcohol abuse.  Past Medical History:  Diagnosis Date   Barrett's  esophagus    Diverticulosis    GERD (gastroesophageal reflux disease)    Headache    Internal hemorrhoids    Past Surgical History:  Procedure Laterality Date   right leg severely broken Right 03/28/1997   titanium rod, plate and 8 screws   TONSILLECTOMY  03/28/1978   UPPER GASTROINTESTINAL ENDOSCOPY     WISDOM TOOTH EXTRACTION  03/28/1996    reports that he quit smoking about 21 years ago. His smoking use included cigarettes. He started smoking about 41 years ago. He has a 10 pack-year smoking history. He has never used smokeless tobacco. He reports that he does not drink alcohol and does not use drugs. family history includes Alcohol abuse in his mother; Cancer in his paternal grandmother; Cervical cancer in his maternal grandmother; Depression in his mother; Heart disease in his father; Hypertension in his father. Allergies  Allergen Reactions   Penicillins     Per patient unknown action noted in childhood   CLINICAL DATA:  Risk stratification 48 year old male   EXAM: Coronary Calcium Score   TECHNIQUE: The patient was scanned on a [employer] scanner. Axial non-contrast 3 mm slices were carried out through the heart. The data set was analyzed on a dedicated work station and scored using the Agatson method.   FINDINGS: Non-cardiac: See separate report from [facility].   Ascending Aorta: Normal (35 mm) no atherosclerosis.  Pericardium: Normal   Coronary arteries: No calcification.  Normal origins.   IMPRESSION: 1. Coronary calcium score of 0. This was 0 percentile for age and sex matched control. Low risk.   Review of Systems  Constitutional:  Negative for chills, fever, malaise/fatigue and weight loss.  HENT:  Negative for hearing loss.   Eyes:  Negative for blurred vision and double vision.  Respiratory:  Negative for cough and shortness of breath.   Cardiovascular:  Negative for chest pain, palpitations and leg swelling.  Gastrointestinal:   Negative for abdominal pain, blood in stool, constipation and diarrhea.  Genitourinary:  Negative for dysuria.  Skin:  Negative for rash.  Neurological:  Negative for dizziness, speech change, seizures, loss of consciousness and headaches.  Psychiatric/Behavioral:  Negative for depression.       Objective:     BP 110/76   Pulse 97   Temp 97.9 F (36.6 C) (Oral)   Ht 5' 11.65 (1.82 m)   Wt 220 lb 11.2 oz (100.1 kg)   SpO2 95%   BMI 30.22 kg/m  BP Readings from Last 3 Encounters:  02/16/24 110/76  11/08/23 114/70  02/14/23 120/80   Wt Readings from Last 3 Encounters:  02/16/24 220 lb 11.2 oz (100.1 kg)  11/08/23 225 lb 8 oz (102.3 kg)  02/14/23 256 lb 6.4 oz (116.3 kg)      Physical Exam Vitals reviewed.  Constitutional:      General: He is not in acute distress.    Appearance: He is well-developed. He is not ill-appearing.  HENT:     Head: Normocephalic and atraumatic.     Right Ear: External ear normal.     Left Ear: External ear normal.     Nose:     Comments: Slightly dry nasal mucosa left naris.  No lesions noted. Eyes:     Conjunctiva/sclera: Conjunctivae normal.     Pupils: Pupils are equal, round, and reactive to light.  Neck:     Thyroid : No thyromegaly.  Cardiovascular:     Rate and Rhythm: Normal rate and regular rhythm.     Heart sounds: Normal heart sounds. No murmur heard. Pulmonary:     Effort: No respiratory distress.     Breath sounds: No wheezing or rales.  Abdominal:     General: Bowel sounds are normal. There is no distension.     Palpations: Abdomen is soft. There is no mass.     Tenderness: There is no abdominal tenderness. There is no guarding or rebound.  Musculoskeletal:     Cervical back: Normal range of motion and neck supple.  Lymphadenopathy:     Cervical: No cervical adenopathy.  Skin:    Findings: No rash.     Comments: Right axilla reveals small subcutaneous mass consistent with likely epidermal cyst.  No overlying  erythema.  No fluctuance.  No lymphadenopathy.  Neurological:     Mental Status: He is alert and oriented to person, place, and time.     Cranial Nerves: No cranial nerve deficit.     Deep Tendon Reflexes: Reflexes normal.      No results found for any visits on 02/16/24.  Last CBC Lab Results  Component Value Date   WBC 5.0 02/14/2023   HGB 15.7 02/14/2023   HCT 47.6 02/14/2023   MCV 87.0 02/14/2023   MCH 28.1 09/09/2022   RDW 14.3 02/14/2023   PLT 270.0 02/14/2023   Last metabolic panel Lab Results  Component Value Date   GLUCOSE  100 (H) 02/14/2023   NA 139 02/14/2023   K 4.3 02/14/2023   CL 104 02/14/2023   CO2 27 02/14/2023   BUN 18 02/14/2023   CREATININE 1.08 02/14/2023   GFR 80.75 02/14/2023   CALCIUM 9.3 02/14/2023   PROT 7.3 02/14/2023   ALBUMIN 4.5 02/14/2023   LABGLOB 2.8 09/09/2022   BILITOT 0.7 02/14/2023   ALKPHOS 51 02/14/2023   AST 19 02/14/2023   ALT 22 02/14/2023   Last lipids Lab Results  Component Value Date   CHOL 148 02/14/2023   HDL 45.70 02/14/2023   LDLCALC 87 02/14/2023   TRIG 74.0 02/14/2023   CHOLHDL 3 02/14/2023   Last hemoglobin A1c Lab Results  Component Value Date   HGBA1C 5.8 02/14/2023   Last thyroid  functions Lab Results  Component Value Date   TSH 2.450 09/09/2022      The 10-year ASCVD risk score (Arnett DK, et al., 2019) is: 1.9%    Assessment & Plan:   Problem List Items Addressed This Visit   None Visit Diagnoses       Physical exam    -  Primary   Relevant Orders   Basic metabolic panel with GFR   Lipid panel   CBC with Differential/Platelet   Hepatic function panel   Hemoglobin A1c   PSA     48 year old male with history of GERD and Barrett's esophagus.  He has done excellent job this past year with weight loss mostly through increased exercise.  GERD symptoms stable currently on Protonix  twice daily.  Followed closely by GI.  We discussed following health maintenance items  -Continue regular  exercise habits - Obtain follow-up labs as above - Discussed multiple vaccines including Shingrix, Prevnar 20, influenza, tetanus and he declines - Reassurance regarding likely sebaceous cyst right axilla - Will need repeat EGD 12-26  No follow-ups on file.    Wolm Scarlet, MD

## 2024-02-17 ENCOUNTER — Ambulatory Visit: Payer: Self-pay | Admitting: Family Medicine
